# Patient Record
Sex: Female | Born: 1935 | Race: White | Hispanic: No | Marital: Married | Smoking: Never smoker
Health system: Southern US, Community
[De-identification: ages and names within clinical notes are randomized; demographics above are authoritative.]

## PROBLEM LIST (undated history)

## (undated) DIAGNOSIS — E7800 Pure hypercholesterolemia, unspecified: Secondary | ICD-10-CM

## (undated) DIAGNOSIS — I10 Essential (primary) hypertension: Secondary | ICD-10-CM

## (undated) HISTORY — PX: APPENDECTOMY: SHX54

## (undated) HISTORY — PX: CORONARY ARTERY BYPASS GRAFT: SHX141

---

## 2001-02-19 ENCOUNTER — Encounter: Payer: Self-pay | Admitting: Surgery

## 2001-02-19 ENCOUNTER — Encounter: Admission: RE | Admit: 2001-02-19 | Discharge: 2001-02-19 | Payer: Self-pay | Admitting: Surgery

## 2001-02-19 ENCOUNTER — Ambulatory Visit (HOSPITAL_BASED_OUTPATIENT_CLINIC_OR_DEPARTMENT_OTHER): Admission: RE | Admit: 2001-02-19 | Discharge: 2001-02-20 | Payer: Self-pay | Admitting: Surgery

## 2005-01-06 ENCOUNTER — Inpatient Hospital Stay (HOSPITAL_COMMUNITY): Admission: EM | Admit: 2005-01-06 | Discharge: 2005-01-15 | Payer: Self-pay | Admitting: Emergency Medicine

## 2005-01-07 ENCOUNTER — Encounter (INDEPENDENT_AMBULATORY_CARE_PROVIDER_SITE_OTHER): Payer: Self-pay | Admitting: Cardiology

## 2005-01-11 ENCOUNTER — Encounter (INDEPENDENT_AMBULATORY_CARE_PROVIDER_SITE_OTHER): Payer: Self-pay | Admitting: *Deleted

## 2005-01-27 ENCOUNTER — Encounter: Admission: RE | Admit: 2005-01-27 | Discharge: 2005-01-27 | Payer: Self-pay | Admitting: Cardiology

## 2006-04-05 ENCOUNTER — Inpatient Hospital Stay (HOSPITAL_COMMUNITY): Admission: EM | Admit: 2006-04-05 | Discharge: 2006-04-06 | Payer: Self-pay | Admitting: Emergency Medicine

## 2006-04-06 ENCOUNTER — Encounter (INDEPENDENT_AMBULATORY_CARE_PROVIDER_SITE_OTHER): Payer: Self-pay | Admitting: Cardiology

## 2007-12-30 ENCOUNTER — Inpatient Hospital Stay (HOSPITAL_COMMUNITY): Admission: EM | Admit: 2007-12-30 | Discharge: 2008-01-05 | Payer: Self-pay | Admitting: Emergency Medicine

## 2009-04-05 ENCOUNTER — Inpatient Hospital Stay (HOSPITAL_COMMUNITY): Admission: EM | Admit: 2009-04-05 | Discharge: 2009-04-09 | Payer: Self-pay | Admitting: Emergency Medicine

## 2009-10-16 IMAGING — CR DG RIBS W/ CHEST 3+V*R*
1 series · 1 of 1 positions shown · non-contrast
Comparison: 04/05/2006

CLINICAL DATA: 89-year-old female fall, trauma, right chest pain

RIGHT RIBS AND CHEST - 3+ VIEW

[view not recorded]
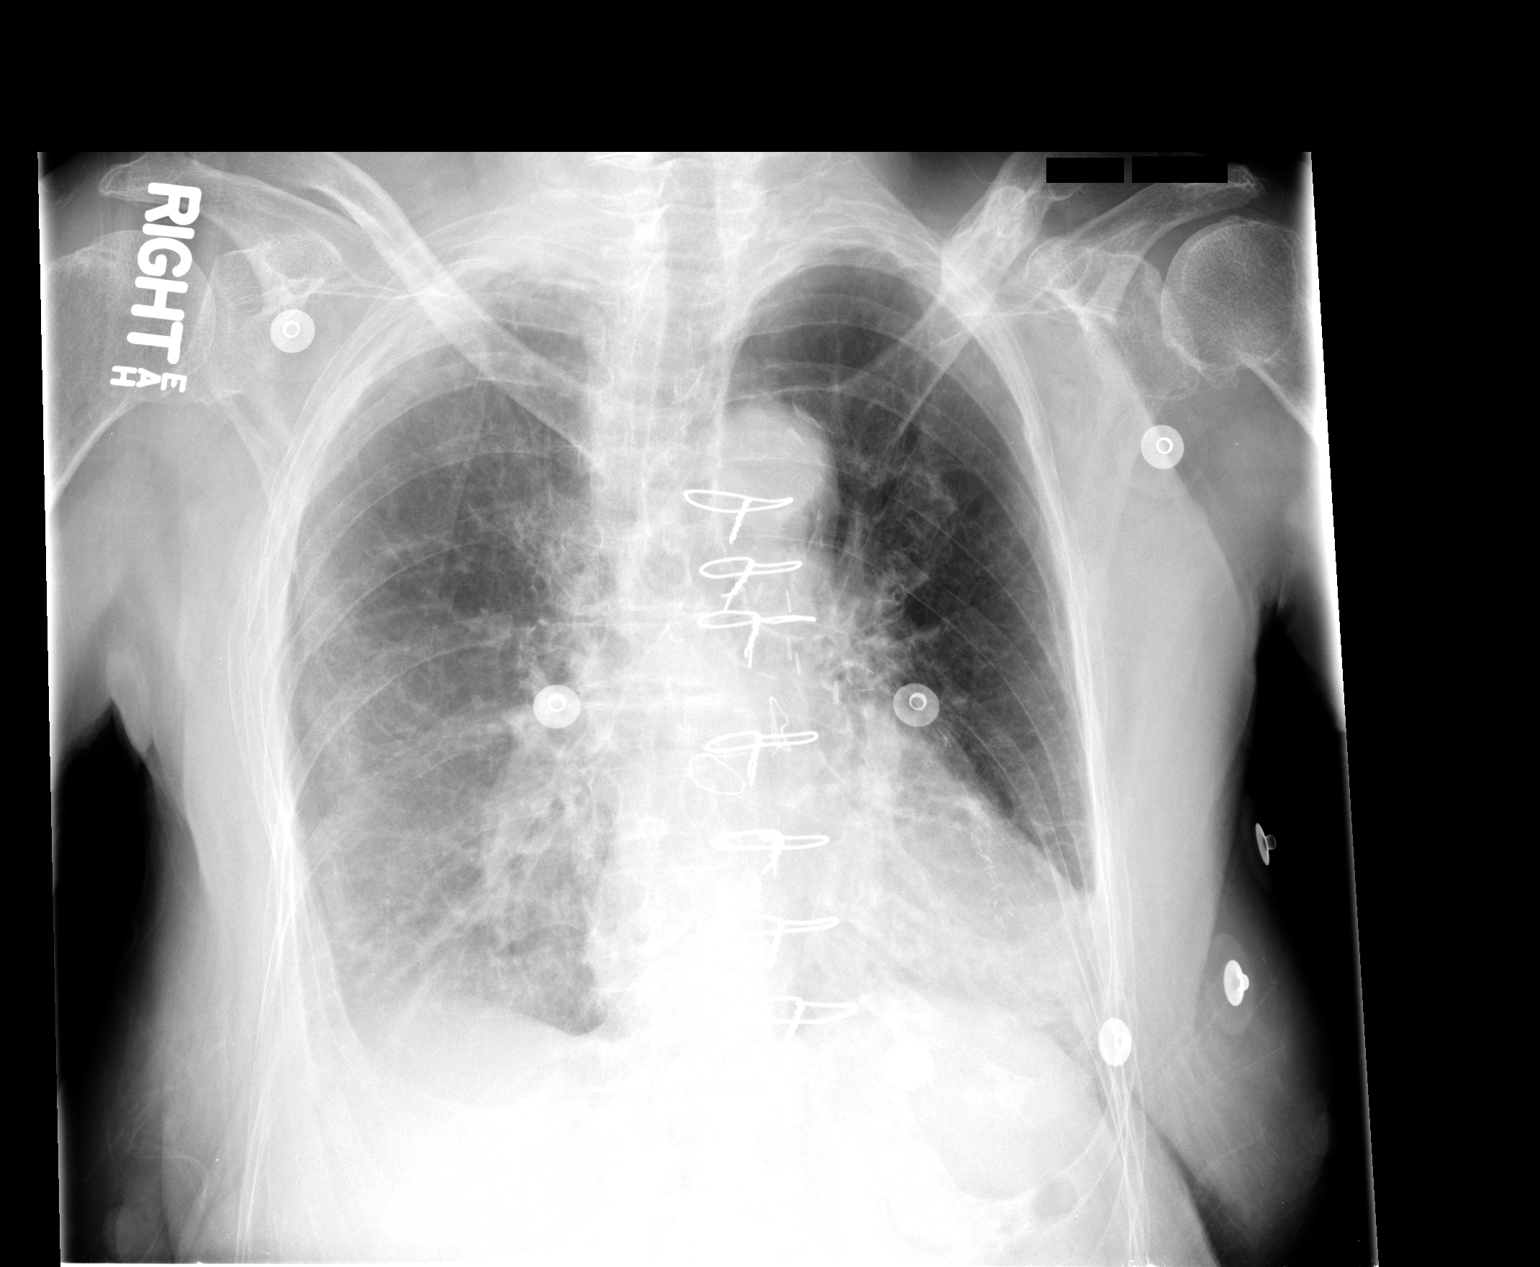

[1 of 1 positions shown; findings below may reference images not displayed]

FINDINGS: There is an acute right distal clavicle fracture at the
AC joint.  Several acute right rib fractures are noted involving
the right second through seventh ribs.  There also are remote right
rib fractures laterally.  Blunting of the right costophrenic angle
is noted ,  a small effusion is not excluded.  No large
pneumothorax.  Heart is enlarged.  The patient is status post
previous bypass surgery.  No definite CHF pattern.  Bones are
osteopenic.
IMPRESSION: Acute right second through seventh rib fractures.

Right distal clavicle fracture.

Small right effusion.

No definite pneumothorax.

## 2009-10-17 IMAGING — CR DG HIP COMPLETE 2+V*R*
3 series · 3 of 3 positions shown · non-contrast
Comparison: CT pelvis 04/05/2006

CLINICAL DATA: Status post fall.

RIGHT HIP - COMPLETE 2+ VIEW

[t pelvis a.p.]
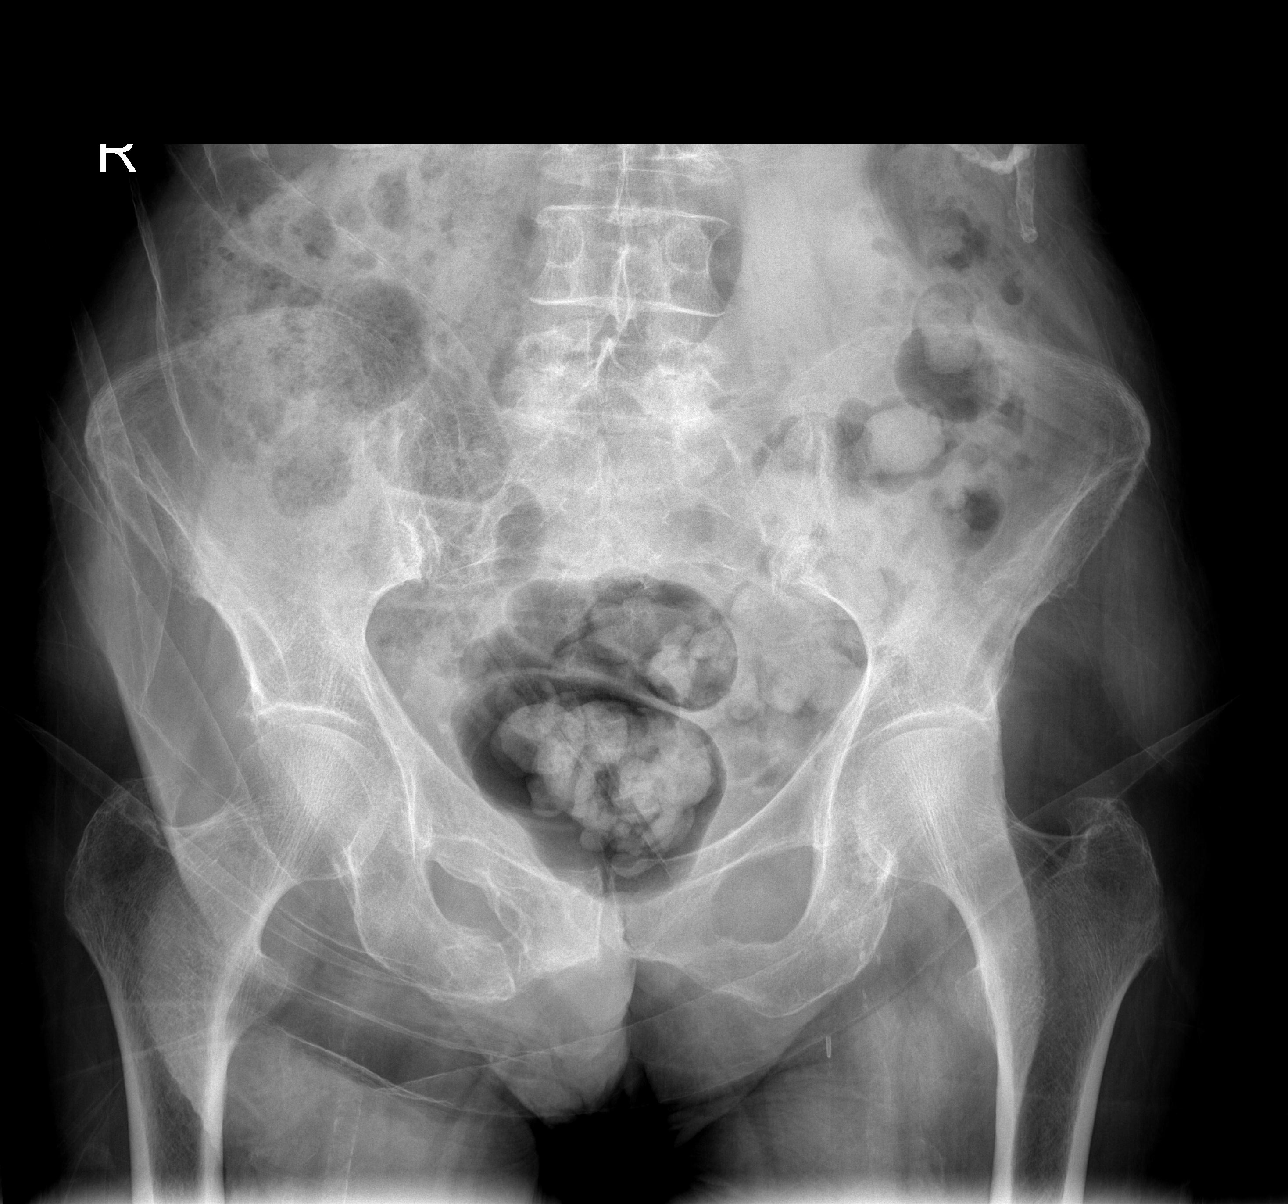

[t hip ap right]
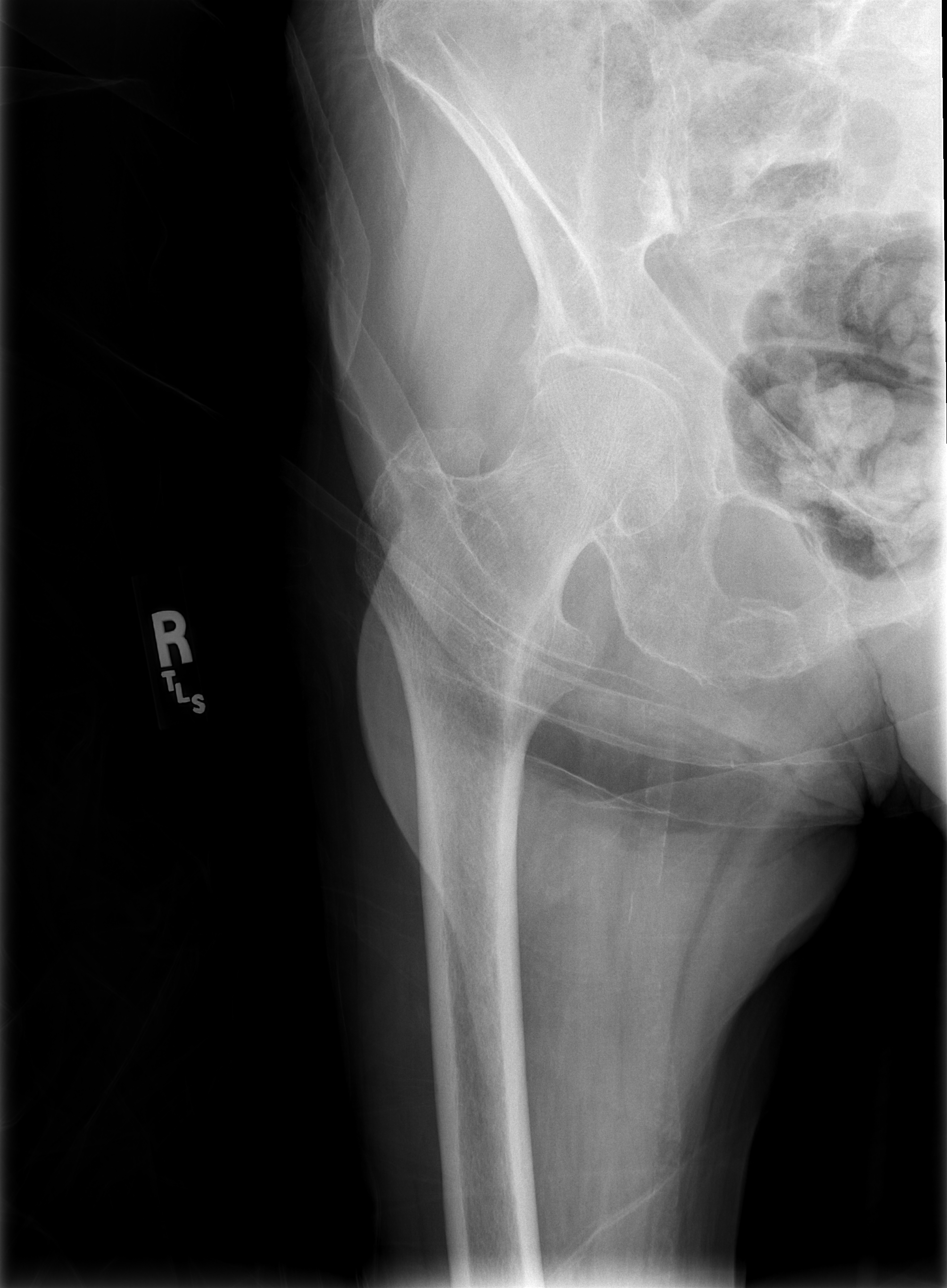

[t hip frog leg right]
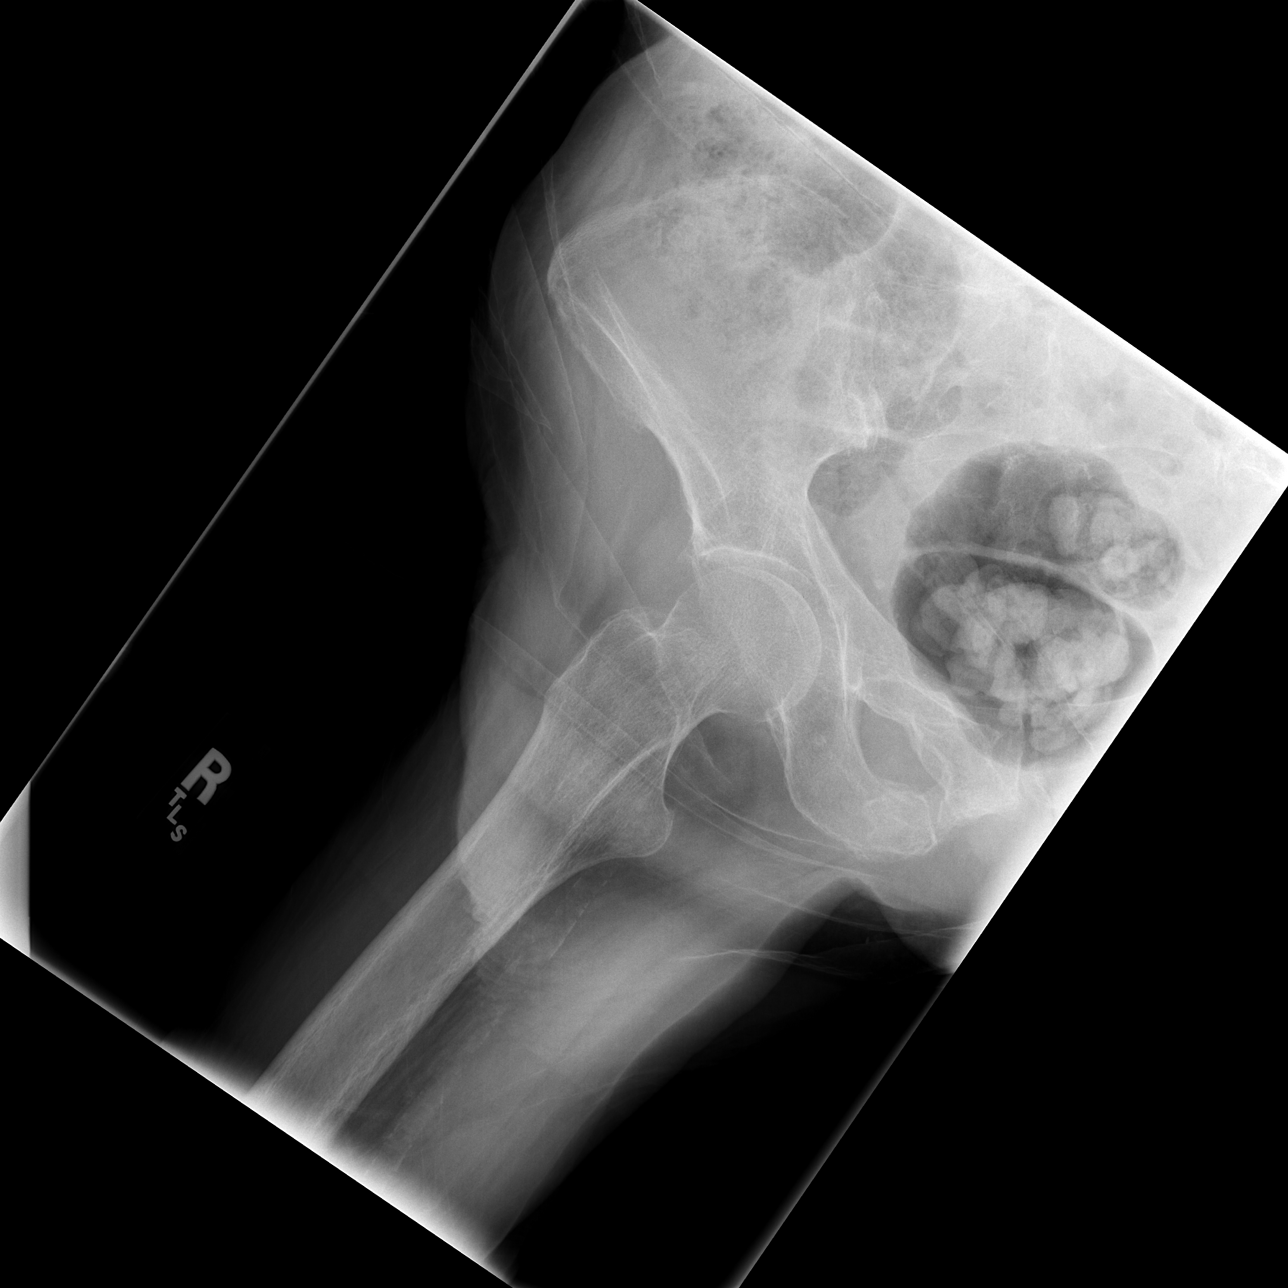

[3 of 3 positions shown; findings below may reference images not displayed]

FINDINGS: The right hip is in normal position.  No fracture is
identified.  Post-traumatic deformity of the right inferior ramus
is noted.
IMPRESSION: Negative for acute fracture.

## 2009-10-18 IMAGING — CR DG CHEST 1V PORT
1 series · 1 of 1 positions shown · non-contrast
Comparison: 12/31/2007

CLINICAL DATA: Status post fall

PORTABLE CHEST - 1 VIEW

[view not recorded]
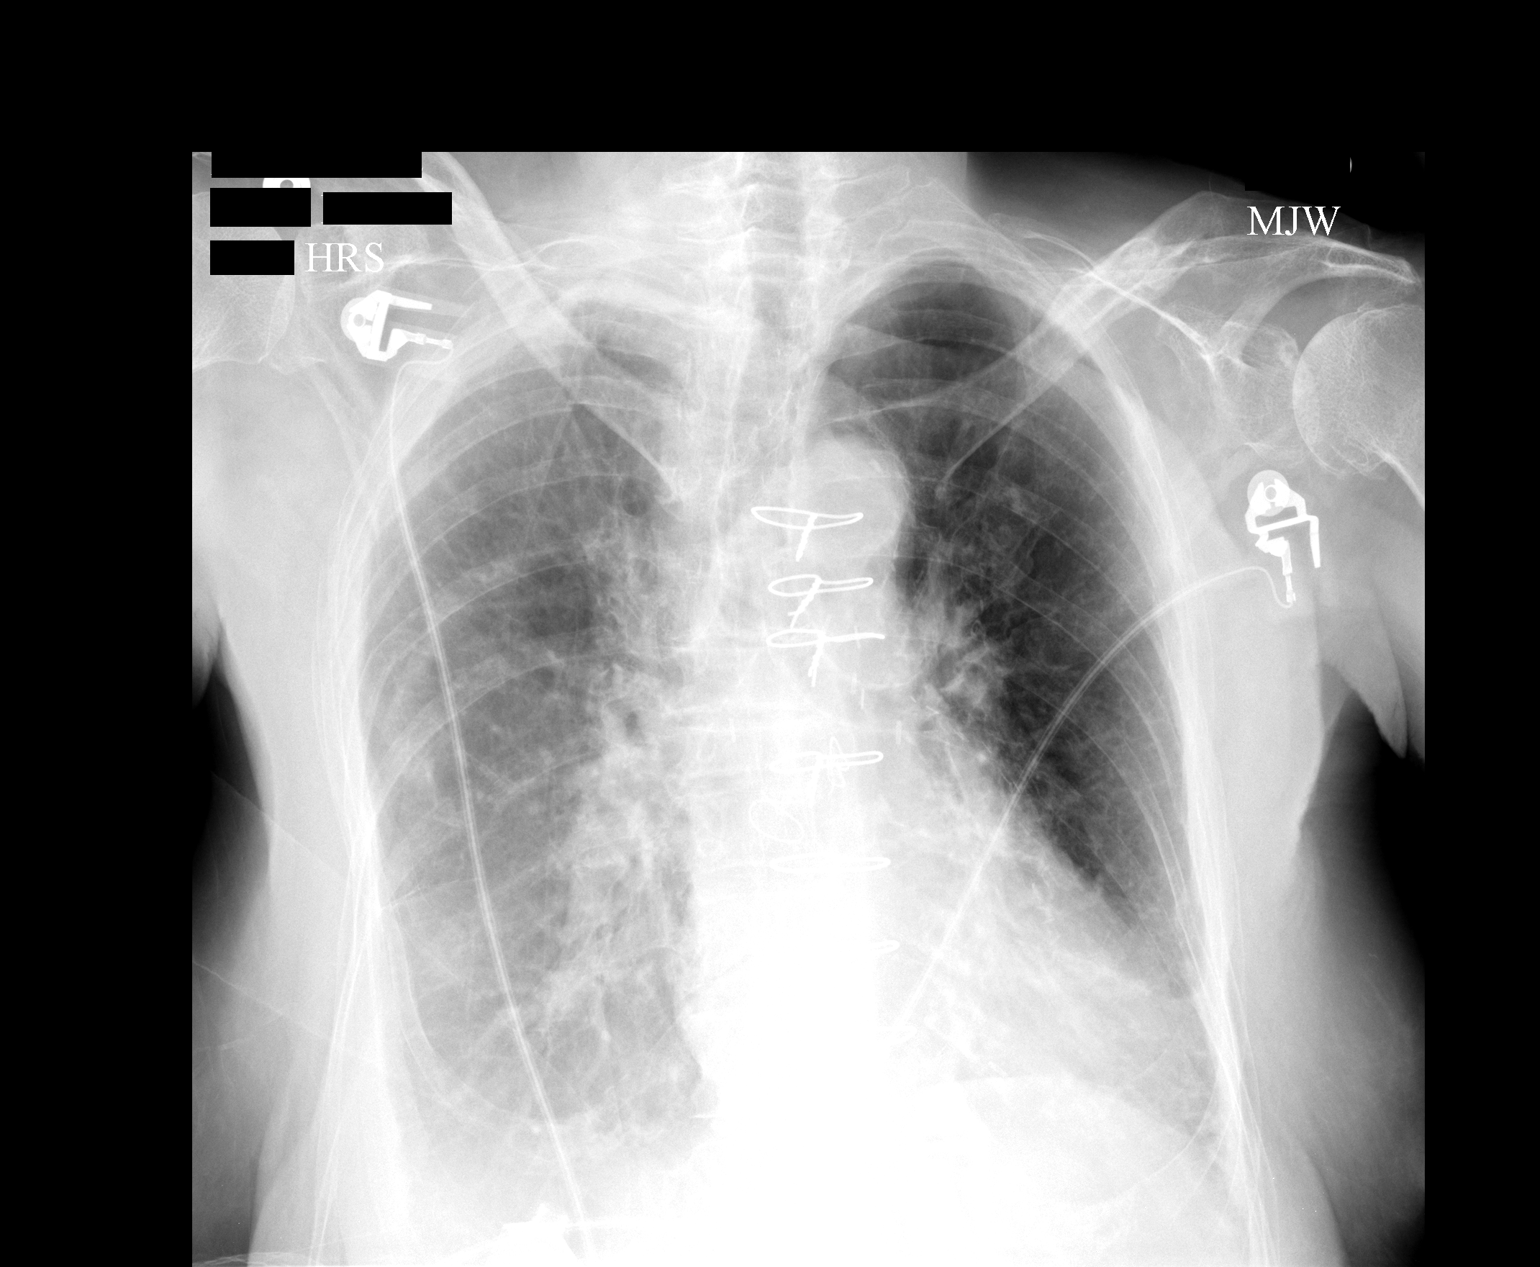

[1 of 1 positions shown; findings below may reference images not displayed]

FINDINGS: Again noted cardiomegaly and status post median
sternotomy.  The left lung is clear.  Right rib fractures again
noted without significant change.  There is a small right pleural
effusion with right basilar atelectasis or infiltrate. Pleural
thickening noted right apex .  Mild central vascular congestion
without convincing edema.
IMPRESSION: Right  rib fractures again noted without significant change. There
is small right pleural effusion with right basilar atelectasis or
infiltrate.

## 2010-04-24 ENCOUNTER — Encounter: Payer: Self-pay | Admitting: Surgery

## 2010-06-20 LAB — CBC
HCT: 32.8 % — ABNORMAL LOW (ref 36.0–46.0)
HCT: 32.9 % — ABNORMAL LOW (ref 36.0–46.0)
HCT: 36.8 % (ref 36.0–46.0)
Hemoglobin: 12.2 g/dL (ref 12.0–15.0)
MCHC: 33.1 g/dL (ref 30.0–36.0)
MCV: 92.3 fL (ref 78.0–100.0)
Platelets: 211 10*3/uL (ref 150–400)
RBC: 3.56 MIL/uL — ABNORMAL LOW (ref 3.87–5.11)
RBC: 3.99 MIL/uL (ref 3.87–5.11)
RDW: 15.7 % — ABNORMAL HIGH (ref 11.5–15.5)
WBC: 10.6 10*3/uL — ABNORMAL HIGH (ref 4.0–10.5)
WBC: 6.7 10*3/uL (ref 4.0–10.5)
WBC: 9.5 10*3/uL (ref 4.0–10.5)

## 2010-06-20 LAB — COMPREHENSIVE METABOLIC PANEL
BUN: 29 mg/dL — ABNORMAL HIGH (ref 6–23)
CO2: 21 mEq/L (ref 19–32)
Calcium: 8.5 mg/dL (ref 8.4–10.5)
Chloride: 108 mEq/L (ref 96–112)
Creatinine, Ser: 0.82 mg/dL (ref 0.4–1.2)
GFR calc Af Amer: >60 mL/min (ref >60)
GFR calc non Af Amer: >60 mL/min (ref >60)
Glucose, Bld: 105 mg/dL — ABNORMAL HIGH (ref 70–99)
Total Bilirubin: 1 mg/dL (ref 0.3–1.2)

## 2010-06-20 LAB — LIPID PANEL
Cholesterol: 138 mg/dL (ref 0–200)
HDL: 43 mg/dL (ref >39)
Total CHOL/HDL Ratio: 3.2 RATIO
Triglycerides: 96 mg/dL (ref <150)

## 2010-06-20 LAB — CARDIAC PANEL(CRET KIN+CKTOT+MB+TROPI)
CK, MB: 3.7 ng/mL (ref 0.3–4.0)
Relative Index: INVALID (ref 0.0–2.5)
Relative Index: INVALID (ref 0.0–2.5)
Relative Index: INVALID (ref 0.0–2.5)
Total CK: 83 U/L (ref 7–177)
Troponin I: 0.04 ng/mL (ref 0.00–0.06)
Troponin I: 0.06 ng/mL (ref 0.00–0.06)

## 2010-06-20 LAB — URINALYSIS, ROUTINE W REFLEX MICROSCOPIC
Bilirubin Urine: NEGATIVE
Glucose, UA: NEGATIVE mg/dL
Ketones, ur: NEGATIVE mg/dL
Specific Gravity, Urine: 1.021 (ref 1.005–1.030)
pH: 6.5 (ref 5.0–8.0)

## 2010-06-20 LAB — BASIC METABOLIC PANEL
BUN: 20 mg/dL (ref 6–23)
BUN: 28 mg/dL — ABNORMAL HIGH (ref 6–23)
CO2: 27 mEq/L (ref 19–32)
Calcium: 9.3 mg/dL (ref 8.4–10.5)
Chloride: 105 mEq/L (ref 96–112)
Creatinine, Ser: 0.78 mg/dL (ref 0.4–1.2)
Creatinine, Ser: 0.87 mg/dL (ref 0.4–1.2)
GFR calc Af Amer: >60 mL/min (ref >60)
GFR calc non Af Amer: >60 mL/min (ref >60)
GFR calc non Af Amer: >60 mL/min (ref >60)
Glucose, Bld: 130 mg/dL — ABNORMAL HIGH (ref 70–99)
Glucose, Bld: 96 mg/dL (ref 70–99)
Potassium: 3.8 mEq/L (ref 3.5–5.1)
Potassium: 3.8 mEq/L (ref 3.5–5.1)
Sodium: 139 mEq/L (ref 135–145)

## 2010-06-20 LAB — DIFFERENTIAL
Eosinophils Relative: 1 % (ref 0–5)
Lymphocytes Relative: 12 % (ref 12–46)
Lymphs Abs: 1.1 10*3/uL (ref 0.7–4.0)
Monocytes Absolute: 0.7 10*3/uL (ref 0.1–1.0)
Neutro Abs: 7.5 10*3/uL (ref 1.7–7.7)

## 2010-06-20 LAB — URINE CULTURE
Colony Count: NO GROWTH
Culture: NO GROWTH

## 2010-06-20 LAB — TSH: TSH: 2.771 u[IU]/mL (ref 0.350–4.500)

## 2010-08-17 NOTE — Consult Note (Signed)
[name], [name] NO.:  [id number]   MEDICAL RECORD NO.:  [id number]          PATIENT TYPE:  EMS   LOCATION:  [facility]                         FACILITY:  [facility]   PHYSICIAN:  Sandria Bales. Ezzard Standing, M.D.  DATE OF BIRTH:  [birth year]   DATE OF CONSULTATION:  DATE OF DISCHARGE:                                 CONSULTATION   Date of admission ??   HISTORY OF ILLNESS:  This is an 77 year old white female who is patient  of Dr. Burnell Blanks has seen Dr. Myrene Galas recently because of some  left knee problems, sees Dr. Viann Fish from a cardiac standpoint.   She has had multiple falls.  She actually injured herself 2 or 3 months  ago.  It was felt like Dr. Carola Frost treated some left patellar or knee  problems with a cast.  She just got out of it 2 or 3 weeks ago.  She  then fell last night, falling straight backwards according to her  history.  Her family kept her home during the night, but because of  pain, brought her to the emergency room.   X-rays in emergency room have revealed a right clavicle fracture and  looks like apparent right posterior rib fractures 2 through 7.   PAST MEDICAL HISTORY:  The patient is allergic to SULFA.  There is  pretty remote history, unclear.   CURRENT MEDICATIONS:  1. Metoprolol 25 mg b.i.d.  2. Lisinopril 10 mg b.i.d.  3. Vytorin.  4. Levothroid dose unknown.  5. Fosamax.  6. Paroxetine 20 mg at bedtime.  7. She takes a multivitamin.   REVIEW OF SYSTEMS:  NEUROLOGIC:  She had a stroke in 1989, but she says  she has fully recovered from this and no neurologic deficits.  She has  had no further neurologic events.  PULMONARY:  She has no history of  pneumonia or tuberculosis, does not smoke cigarettes.  CARDIAC:  She had a 4-vessel coronary artery bypass Dr. Evelene Croon in  October 2006.  She sees Dr. Viann Fish from a cardiac standpoint and  said she saw him in the last month or 2.  She is apparently been stable  from a  cardiac standpoint.  These falls had been more from a balance and  just activity issue than from any neurologic or syncopal issue.  She  does have hypertension.  GASTROINTESTINAL:  She has had no peptic ulcer disease, liver disease or  colon disease.  GYN:  She had a hysterectomy in 1969.  She has 4 children, the older of  her children, Tracey Harries is at the bedside.  ENDOCRINE:  She is hypothyroid on thyroid replacement, unsure of the  medicine.  MUSCULOSKELETAL:  She has got out of being treated by Dr. Carola Frost about 3  weeks ago for dislocation of her knee.  She thinks it is the right knee.   SOCIAL HISTORY:  She lives with her husband.  Her daughter Tracey Harries  lives near by and she has 4 children.   PHYSICAL EXAMINATION:  VITAL SIGNS:  Her temperature 98.1, pulse  61,  blood pressure 126/60.  HEENT:  Unremarkable.  She has no obvious head injury.  NECK:  Supple, shows no mass, no thyromegaly.  LUNGS:  She has trouble with deep inspiration with decreased breath  sounds in the right side.  She is tender over the right clavicle midway.  She is tender over the right chest.  I do not feel much in the left  chest.  She has a well-healed median sternotomy scar.  HEART:  Her heart has regular rate and rhythm without murmur or rub.  ABDOMEN:  Soft.  I feel no tenderness, no mass.  EXTREMITIES:  She has deformity of both wrists.  She has a bruise over  the right wrist.  She said she had old fracture, which she never really  got fixed.  She has an abrasion and laceration of her right lower leg,  which has been taken care of by [employer] at home.  She has good  strength in upper and lower extremities.  She is a thin,  older white  female.   Her labs I have show a sodium 139, potassium 4.0, chloride 111, BUN 42,  creatinine 2.0 on one and 1.6 on another lab.  Her hemoglobin 10,  hematocrit 30, and white blood count 11,500.   DIAGNOSES:  1. Right mid clavicle fracture.  She sees Dr.  Myrene Galas.  We will      get in touch with him tomorrow.  2. Right rib fracture 2 through 7, needs to be observed in hospital at      least for couple of days.  Though she is not very tender and doing      pretty well right now, she is having some trouble moving.  3. History of falls, presumably secondary to trying to go too fast and      not any kind of neurological or cardiac event that we are aware of.  4. History of coronary artery bypass graft, for which she has done      well, followed by Dr. Viann Fish.  5. Hypertension.  6. Hypothyroidism, replaced.  She does not know her medicine dose.  7. History of left knee cast for dislocated or injured left knee.      This was managed by Dr. Judie Petit. Handy.  8. Remote history of stroke with no recent symptoms.      Sandria Bales. Ezzard Standing, M.D.  Electronically Signed     DHN/MEDQ  D:  12/30/2007  T:  12/31/2007  Job:  [id number]   cc:   Burnell Blanks, MD  Georga Hacking, M.D.  Doralee Albino. Carola Frost, M.D.

## 2010-08-17 NOTE — H&P (Signed)
NAMEPAISLEE, SZATKOWSKI NO.:  [id number]   MEDICAL RECORD NO.:  [id number]          PATIENT TYPE:  EMS   LOCATION:  [facility]                         FACILITY:  [facility]   PHYSICIAN:  Elliot Cousin, M.D.    DATE OF BIRTH:  08-Aug-1916   DATE OF ADMISSION:  12/30/2007  DATE OF DISCHARGE:                              HISTORY & PHYSICAL   PRIMARY CARE PHYSICIAN:  Burnell Blanks[place] M.[place]. [place].   PRIMARY CARDIOLOGIST:  Georga Hacking, M.D.   PRIMARY ORTHOPEDIC SURGEON:  Doralee Albino. Carola Frost, M.D.   CHIEF COMPLAINT:  Fall after feeling dizzy and resultant pain.   HISTORY OF PRESENT ILLNESS:  The patient is an 77 year old woman with a  past medical history significant for coronary artery disease, status  post four-vessel CABG, diet-controlled diabetes mellitus, and history of  dizziness with recurrent falls.  She presents to the emergency  department with the chief complaint of right shoulder pain and right-  sided chest pain.  Apparently, the patient was walking up her steps at  home yesterday at approximately 4 p.m.  She became lightheaded and fell  backward on the cement sidewalk.  She fell on her right side.  At the  time, she did have some mild pain about her right shoulder and right  chest.  She scraped her right leg on the cement upon falling.  She did  hit her head, but she did not lose consciousness.  She denies loss of  consciousness prior to the fall.  Her family helped her back into the  house.  They decided to monitor her overnight.  However, this morning  the patient was in severe pain and was unable to ambulate because of the  pain.  She describes her pain as achy and sometimes sharp. It is now a 4-  5/10 in intensity. The pain is located primarily over the right shoulder  and neck and the right chest wall.  She denies current headache.  She is  able to move her legs in the supine position.  However, because of the  chest pain and shoulder pain,  she has difficulty ambulating.  She does  not recall chest pain, palpitations or shortness of breath prior to her  falling yesterday.  She generally ambulates with a walker.  However,  yesterday as she was walking up the steps at home, she was not using a  walker.  The patient has a history of multiple falls in the past  attributed to dizziness.  She has been evaluated extensively according  to the patient and family's history.   During the evaluation in the emergency department, the patient is noted  to be afebrile and hemodynamically stable.  A number of radiographic  studies were ordered.  The results are significant for an acute right  2nd through 7th rib fractures, right distal clavicle fracture, and a  small right effusion.  No evidence of pneumothorax per the rib film.  The x-ray of her right wrist reveals a healed remote distal radial  fracture, osteoarthritis, and osteopenia.  The x-ray of her  right  shoulder reveals an acute distal right clavicle fracture and osteopenia.  CT scan of her head reveals no acute intracranial abnormality and no  acute traumatic injury identified; chronic ischemic changes and mild  inflammatory changes in the right sphenoid sinus.  Trauma  surgeon/general surgeon, Dr. Ezzard Standing, evaluated the patient and  recommended pain management.  According to his note, he will contact the  patient's orthopedic surgeon, Dr. Carola Frost, tomorrow.  The patient will be  admitted for further evaluation and management.   PAST MEDICAL HISTORY:  1. History of dizziness and falls and recurrent syncope in the past.      Apparently the patient was evaluated extensively approximately 2-3      years ago and the workup was negative per history.  2. Coronary artery disease, status post four-vessel CABG in October      2006.  3. Hypertension.  4. Hyperlipidemia.  5. Diet-controlled diabetes mellitus.  6. Hypothyroidism.  7. Depression with anxiety.  8. History of stroke.  9.  Diverticulosis.  10.Osteoporosis.  11.Osteoarthritis.  12.Osteopenia.  13.Cataracts.  14.Status post hysterectomy.  15.Recent left knee arthroscopic surgery by Dr. Carola Frost in August 2009.   MEDICATIONS:  1. Vicodin 5/325 mg given once.  2. Acetaminophen 500 mg 2 tablets given once.  3. Levothyroxine question dose daily.  4. Calcium with vitamin D 1 tablet daily.  5. Multivitamin once daily.  6. Metoprolol 25 mg half a tablet b.i.d.  7. Detrol LA 4 mg once daily.  8. Fenofibrate 134 mg once daily.  9. Darvocet 1 tablet every morning.  10.Lisinopril 20 mg  half a tablet b.i.d.  11.Paroxetine 20 mg at bedtime.   ALLERGIES:  The patient has an allergy to SULFA drugs.   SOCIAL HISTORY:  The patient is married.  She lives with her husband in  [place], [place].  She has a Manufacturing engineer 5 days  weekly.  Her family rotates weekly with assistance.  She generally  ambulates with a walker.  She does not drive.  She denies alcohol,  tobacco and illicit drug use.   FAMILY HISTORY:  Noncontributory because of age.   REVIEW OF SYSTEMS:  The patient's review of systems is positive for  urinary incontinence, arthritic changes and pain in her knees.  Her  review of systems is negative for pain with urination, fever, chills,  headache, chest pain, shortness of breath, nausea, abdominal pain,  vomiting and diarrhea.   PHYSICAL EXAMINATION:  VITAL SIGNS: Temperature 98.1, blood pressure  126/60, pulse 61, respiratory rate 18, oxygen saturation 100% on oxygen  supplementation.  GENERAL:  The patient is a pleasant, alert, elderly 77 year old  Caucasian woman who is currently lying in bed in no acute distress.  HEENT:  Head is normocephalic, nontraumatic.  I do not detect a scalp  abrasion or nodule.  Pupils equal, round, reactive to light.  Extraocular movements are intact.  Conjunctivae are clear.  Sclerae are  white.  Tympanic membranes reveal mild cerumen bilaterally;  otherwise no  acute changes.  NECK:  Supple.  No adenopathy, no thyromegaly, no bruit or JVD.  LUNGS:  Decreased breath sounds in the bases.  HEART:  S1 and S2 with a soft systolic murmur.  CHEST:  There is a well-healed sternotomy scar.  The distal right  clavicle is mildly to moderately tender.  The right chest wall has a few  ecchymoses; otherwise no skin abrasions.  Mildly to moderately tender  over the right lateral chest wall.  ABDOMEN:  Positive bowel sounds, soft, nontender, nondistended.  No  hepatosplenomegaly, no masses palpated.  GU/RECTAL:  Deferred.  EXTREMITIES:  A small area of abrasion on the right lateral ankle, not  bleeding.  Diffuse arthritic changes seen in her knees and her feet  bilaterally.  The patient has an excellent range of motion of her lower  extremities bilaterally with regards to knee and hip flexion, extension  and internal and external rotation.  Pedal pulses palpable bilaterally.  NEUROLOGIC:  The patient is alert and oriented x3.  Cranial nerves II-  XII are intact.  Strength in the supine position is 5/5 upper and lower  extremities symmetrically.  Sensation is grossly intact.  Gait was not  assessed.   LABORATORY DATA:  Radiographic results are above.  PTT 25.  PT 13.5. WBC  11.5, hemoglobin 9.8, platelets 172,000, sodium 138, potassium 4,  chloride 111, glucose 159, BUN 42, creatinine 2, ionized calcium 1.09,  bicarbonate 22.  Urinalysis nitrite positive, leukocytes moderate, micro  urine 11-20 wbc's and many bacteria.   ASSESSMENT:  1. Status post fall secondary to dizziness.  The patient has a      recurrent history of falls attributed to dizziness.  The patient      may have a disequilibrium problem or chronic labyrinthitis.      According to her history, she did not lose consciousness prior to      or after the fall.  2. Acute distal right clavicle fracture, acute right rib fractures (2      through 7).  There is no evidence of a  pneumothorax on the rib      films.  However, there is a small effusion.  The patient is      experiencing a significant amount of discomfort, particularly when      she tries to ambulate.  3. Urinary tract infection.  The patient does have urinary      incontinence, however, no dysuria.  With this clinical scenario, I      would favor treating the patient with an antibiotic for a      subclinical urinary tract infection.  The patient's white blood      cell count is mildly elevated as well.  She is currently afebrile.  4. Acute renal insufficiency.  The patient's BUN is 42 and her      creatinine is 2.  She is treated chronically with an ACE inhibitor.      The patient appears to be somewhat volume depleted.  5. Anemia.  The patient's hemoglobin is currently 9.8.  Baseline      hemoglobin is unknown at this time.  6. History of coronary artery disease, hypertension, type 2 diabetes      mellitus, hypothyroidism, and hyperlipidemia.  These medical      conditions appear to be somewhat stable.  However, she will be      monitored and evaluated accordingly.   PLAN:  1. Trauma surgeon, Dr. Ezzard Standing, evaluated the patient and will call Dr.      Carola Frost tomorrow according to his note.  2. Will consult Dr. Carola Frost, the patient's orthopedic surgeon, in the      morning if he has not been already contacted by Dr. Ezzard Standing.  3. Will start pain management with scheduled dosing of Tylenol and as-      needed dosing of Vicodin and morphine.  4. Will start gentle IV fluids and follow the patient's renal      function.  5. Will culture the urine and start Rocephin empirically.  6. Will encourage the patient to use the incentive spirometry.  Will      ask the physical and occupational therapist to assist the patient      with balance training and reconditioning.  7. Will hold the ACE inhibitor.  8. Will check iron studies.  9. For further evaluation, will rule out MI, will assess her thyroid       function, will check a vitamin B12 level, assess her A1c, and      assess for orthostatic hypotension.      Elliot Cousin, M.D.  Electronically Signed     DF/MEDQ  D:  12/30/2007  T:  12/30/2007  Job:  [id number]   cc:   Burnell Blanks, MD  Georga Hacking, M.D.  Doralee Albino. Carola Frost, M.D.

## 2010-08-17 NOTE — Discharge Summary (Signed)
[name], [name] NO.:  [id number]   MEDICAL RECORD NO.:  [id number]          PATIENT TYPE:  INP   LOCATION:  2005                         FACILITY:  [facility]   PHYSICIAN:  Hind I Elsaid, MD      DATE OF BIRTH:  10-18-1916   DATE OF ADMISSION:  12/30/2007  DATE OF DISCHARGE:  01/05/2008                               DISCHARGE SUMMARY   PRIMARY CARE PHYSICIAN:  Burnell Blanks, [place] in [place], [place].   CARDIOLOGIST:  Georga Hacking, M.D.   DISCHARGE DIAGNOSES:  1. Acute distal right clavicle fracture.  2. Acute right rib fracture II through VII.  3. Right pleural effusion, no evidence of hemothorax.  4. Escherichia coli urinary tract infection.  5. Vitamin B12 deficiency.  6. Delirium, resolved.  7. Anemia.  8. History of coronary artery disease.  9. Hypertension.  10.Type 2 diabetes mellitus.  11.Hypothyroidism.  12.Hyperlipidemia.   DISCHARGE MEDICATIONS:  1. Vicodin 5/325 p.o. q.6 h. as needed.  2. Synthroid 75 mcg daily.  3. Lisinopril 10 mg twice daily.  4. Metoprolol 25 mg twice daily.  5. Detrol 4 mg everyday.  6. TriCor 145 mg daily.  7. Calcium Os-Cal 3 times daily.  8. _zocor 20 mg at bedtime.  9. Macrobid 50 mg q.6 h. for 2 days.  10.Vitamin B12 p.o.  11.MiraLax 17 g p.o. daily.  12.Senna 2 tablets p.o. q.h.s.   CONSULTATION:  Trauma Surgery consulted.   PROCEDURES:  1. Chest x-ray, mild increased volume of the right effusion, stable      acute right rib fracture.  2. Rib x-ray, acute right II through VII rib fracture.  Right distal      clavicle fracture.  Small right effusion, no definitive      pneumothorax.  3. Wrist x-ray: Healed remote distal radius fracture.  Osteoarthritis      and osteopenia.  4. Shoulder x-ray, acute distal right clavicular fracture.  5. CT head.  No acute intracranial abnormality.  6. Hip x-ray negative for acute fracture.  7. Chest x-ray 2-view, improvement in right pleural effusion,  multiple      right fracture and left clavicle fracture without evidence of      pneumothorax.   HISTORY OF PRESENT ILLNESS:  Please review the history done by Dr.  Elliot Cousin.   SUMMARY:  1. This is an 77 year old female with past medical history of coronary      artery disease status post 4-vessel CABG, diet-controlled diabetes      mellitus, and history of dizziness with recurrent falls, presented      at this time with status post fall with right shoulder pain, right      chest pain.  The patient was found to have right clavicular      fracture and right rib fracture.  The patient was evaluated by the      Trauma Surgery.  Recommendation was conservative measurement and no      surgery at this time.  Nonweightbearing of the right upper      extremity, also provided sling  to the right upper extremity for      comfort, and PT and OT consultation was done.  During hospital      stay, the patient has complicated by a period of delirium and      confusion.  Further CT head was negative and was felt delirium is      due to altered mental status secondary to delirium and      pyelonephritis.  The patient significantly improved within 24      hours.  2. Right pleural effusion on the x-ray, this shows some improvement on      her x-ray and the patient did not require any form of invasive      oxygenation.  Trauma Surgery was asked to reevaluate this right      effusion.  AP and lateral view did show improvement, so no need for      tap or chest tube placement at this time.  3. Anemia.  The patient was found to have vitamin B12 of 375 and high      methylmalonic acid, for that the patient was placed on vitamin B12      p.o.  Hemoglobin remained stable during hospital stay.  4. Constipation.  The patient will be discharged with laxative      medications and provide Fleet enema before discharge.  At this      time, we felt the patient is medically stable to be discharged      home,  follow with her primary care physician within 1 week.      Hind Bosie Helper, MD  Electronically Signed     HIE/MEDQ  D:  01/05/2008  T:  01/05/2008  Job:  [id number]   cc:   Burnell Blanks, MD

## 2010-08-20 NOTE — Op Note (Signed)
NAMEEUGENIE, [name] NO.:  [id number]   MEDICAL RECORD NO.:  [id number]          PATIENT TYPE:  INP   LOCATION:  2305                         FACILITY:  [facility]   PHYSICIAN:  Guadalupe Maple, M.D.  DATE OF BIRTH:  [birth year]   DATE OF PROCEDURE:  01/10/2005  DATE OF DISCHARGE:                                 OPERATIVE REPORT   PROCEDURE:  Intraoperative transesophageal echocardiography.   ANESTHESIOLOGIST:  Guadalupe Maple, M.D.   INDICATION:  [name] is an 77 year old white female with a history  of severe three-vessel coronary artery disease and left main coronary  disease, who was noted to have mitral regurgitation and is scheduled to  undergo coronary artery bypass grafting.  Dr. Laneta Simmers requested that we  evaluate the mitral valve with intraoperative transesophageal  echocardiography and to evaluate the left ventricle and determine if any  other valvular pathology was present.   The patient was brought to the operating room at [facility] and  general anesthesia was induced without difficulty.  The transesophageal  echocardiography probe was then inserted into the esophagus without  difficulty.   PREBYPASS FINDINGS:  1.  Mitral valve:  The mitral valve leaflets were thickened and they coapted      well without evidence of prolapse or fluttering and no evidence of torn      chordae.  There was a central jet of mitral regurgitation which was      graded at 1 to 2+.  There was mild blunting of the systolic component of      the right upper pulmonary vein flow by pulse wave Doppler.  The      effective regurgitant orifice area measured 0.133 by the PISA method.      The jet was central and there were no wall-hugging jets.  2.  Aortic valve:  The aortic valve was trileaflet and appeared to open      normally.  There were some Lambl excrescences present on the tips of the      leaflets and there was no aortic insufficiency.  3.  Left ventricle:   There was moderate hypokinesis in the area of the      basilar septal region, but good contractility in all the other areas      interrogated and the ejection fraction was estimated at 50% to 55%.      There was no thrombus noted in the left ventricular apex.  4.  Right ventricle.  The right ventricle appeared to be within normal      limits in size.  There was good contractility of the right ventricular      free wall.  5.  Tricuspid valve:  The tricuspid valve appeared structurally normal with      trace to 1+ tricuspid insufficiency.  6.  Pulmonic valve:  There was trace pulmonic insufficiency.  7.  Interatrial septum:  There was no evidence of atrioseptal defect or      patent foramen ovale.  8.  Left atrium:  There was no evidence of thrombus in the  left atrial or      the left atrial appendage.  The left atrium appeared to be within upper      limits of normal of size.  9.  Proximal ascending aorta:  There was diffuse calcification noted in the      wall of the proximal ascending aorta, but no mobile atheromata present.   POSTBYPASS FINDINGS:  1.  Mitral valve:  There was a central jet of mitral insufficiency which was      again graded at 1 to 2+, unchanged from the prebypass study.  2.  Left ventricle:  The left ventricular function again appeared unchanged      from the prebypass study.  There was good contractility in all segments,      except the basilar septal region which showed moderate-to-severe      hypokinesis.  Ejection fraction was estimated 50% to 55%.  3.  Aortic valve:  The aortic valve was unchanged from the prebypass study,      there was no aortic insufficiency and the valve appeared to open      normally.           ______________________________  Guadalupe Maple, M.D.     DCJ/MEDQ  D:  01/10/2005  T:  01/11/2005  Job:  [id number]

## 2010-08-20 NOTE — Op Note (Signed)
[place]  Patient:    NARDOS, PUTNAM Visit Number: [id number] MRN: [id number]          Service Type: Attending:  Currie Paris, M.D. Dictated by:   Currie Paris, M.D. Proc. Date: 02/19/01   CC:         Burnell Blanks, M.D.   Operative Report  ACCOUNT NO. [id number]. CCS [id number].  PREOPERATIVE DIAGNOSIS:  Bilateral inguinal hernias.  POSTOPERATIVE DIAGNOSIS:  Bilateral inguinal hernias.  PROCEDURE:  Repair of bilateral inguinal hernias.  SURGEON:  Currie Paris, M.D.  ANESTHESIA:  General (LMA).  CLINICAL HISTORY:  Ms. Garczynski is an 77 year old who has bilateral inguinal hernias and desired to have these repaired.  We have decided to do these as an outpatient with an overnight stay for observation, since she is 73, felt that would be most appropriate.  DESCRIPTION OF PROCEDURE:  The patient brought to the operating room, having been seen in the holding area and all questions answered.  After satisfactory general anesthesia, the lower abdomen was prepped and draped.  As I worked, I injected 0.5% Marcaine plus 1% Xylocaine with epinephrine mixed equally  to help with postop analgesia.  This was infiltrated along the left inguinal area and the incision made deep to the external oblique aponeurosis with additional local infiltrated as I got deeper layers.  I opened the external oblique in the line of its fibers and identified posterior structures.  The round ligament and associated nerves were dissected up off the inguinal floor. There was a definite small direct defect present as well as some protruding fat through the internal ring.  I cleaned all of this off and put a suture in to close the direct defect and then laid a mesh patch overlaying the entire floor.  This was then standard patch and was split to go around the cord structures so I did not have to divide those.  This was sutured in inferiorly with a running suture  and then tacked medially with several sutures.  The tails were crossed and then tacked together, producing a nice repair with good reinforcement of the floor and leaving a very narrow opening just before the nerves and round ligament.  Additional local was infiltrated and the incision closed with 3-0 Vicryl, which closed the external oblique and Scarpas, and 4-0 Monocryl subcuticular on the skin.  Prior to closing completely, I dissected down a little bit around the femoral canal to make sure there was no femoral hernia, and there was none.  Attention was turned to the right side and identical repair done with the exception that on the right side there was a large indirect opening with a very patulous indirect ring.  A small mesh patch was placed into that and held with a Prolene, which closed the ring, and then the patch overlaid as on the left side.  Again local was used.  The patient tolerated the procedure well.  There were no operative complications, and all counts were correct. Dictated by:   Currie Paris, M.D. Attending:  Currie Paris, M.D. DD:  02/19/01 TD:  02/19/01 Job: [id number]

## 2010-08-20 NOTE — Discharge Summary (Signed)
[name], CLINE NO.:  [id number]   MEDICAL RECORD NO.:  [id number]          PATIENT TYPE:  INP   LOCATION:  2003                         FACILITY:  [facility]   PHYSICIAN:  Evelene Croon, M.D.     DATE OF BIRTH:  Jan 23, 1917   DATE OF ADMISSION:  01/06/2005  DATE OF DISCHARGE:                                 DISCHARGE SUMMARY   ADMITTING DIAGNOSIS:  Chest pain.   DISCHARGE DIAGNOSES:  1.  Hypertension.  2.  Hyperlipidemia.  3.  Diverticulosis.  4.  Diabetes mellitus.  5.  History of cerebrovascular accident.  6.  Anxiety.  7.  Depression.  8.  Hypothyroidism.  9.  Osteoporosis by history.  10. Left main and severe three-vessel coronary artery disease status post      coronary artery bypass grafting times four.   PAST MEDICAL HISTORY:  1.  Hypertension.  2.  Hyperlipidemia.  3.  Diverticulosis.  4.  Diabetes mellitus.  5.  History of CVA.  6.  Anxiety.  7.  Depression.  8.  Hypothyroidism.  9.  Osteoporosis by history.  10. Left main and severe three-vessel coronary artery disease status post      coronary artery bypass grafting times four.   ALLERGIES:  No known drug allergies.   BRIEF HISTORY:  The patient is an 77 year old Caucasian female who has a  longstanding history of hypertension and hyperlipidemia.  She was evaluated  by Dr. Val Riles approximately two weeks prior to admission at which time  she stated she had a crazy feeling in her chest that radiated to the right  upper arm and lasted for several minutes.  This was not painful, but rather  a pressure.  It was not associated with exertion.  She refused stress  testing at that time.  Since then, however, the patient has experienced  three to four additional episodes, which were associated with shortness of  breath.  After the last episode that kept the patient awake all night she  then presented to Dr. Conchita Paris office again on January 06, 2005 at which  time she was admitted to [facility] emergency room.   The patient was evaluated in the ER by Dr. Donnie Aho and was found to have an  abnormal baseline EKG with an old anteroseptal infarction as well as ST  depression in the lateral leads.  The patient was asymptomatic at that time;  and, she is admitted to a telemetry bed for observation.  She was placed on  Lovenox and IV nitroglycerin.  She was then scheduled for elective cardiac  cath.   HOSPITAL COURSE:  The patient was admitted via the emergency room as  previously stated.  She was maintained on routine hospital care and remained  in stable condition until she could be taken for elective cardiac cath on  January 07, 2005.  This revealed left main and three-vessel coronary artery  disease with moderate left ventricular dysfunction.  Secondary to these  findings Dr. Evelene Croon of the CVTS service was consulted regarding  surgical revascularization.   Dr. Laneta Simmers evaluated  the patient on January 07, 2005 and it was his opinion  that the patient should proceed with coronary artery bypass grafting.   The patient was taken to the OR on January 10, 2005 for coronary artery  bypass grafting times four.  The left internal mammary artery was grafted to  the LAD, saphenous vein was grafted sequentially to the ileum and to the  diagonal, and saphenous vein graft was grafts to the right coronary artery  after right __________  artery endarterectomy.  Endoscopic vein harvesting  was performed on the left lower extremity.  The patient tolerated the  procedure well and was given __________  immediately postoperatively.  The  patient was transferred from the OR to the SICU in stable condition.  The  patient was extubated without complications, awakened from anesthesia and  was neurologically intact.   On postoperative day one the patient was afebrile with stable vital signs  and a paced rhythm.  All invasive lines and chest tubes were discontinued in  a routine manner.  All  drips were weaned accordingly.  The pacemaker was  subsequently discontinued and she has maintained a normal sinus rhythm since  that time.  The patient had some volume overload postoperatively and was  diuresed accordingly.  She will require further diuresis as an outpatient,  short term.   The patient's diabetes mellitus has been well-controlled on sliding scale  insulin and Lantus.  Her blood sugars have been excellent; however,  preoperatively she was not on any medications, but regulated her blood sugar  with a careful diet.  Therefore she will be sent home without oral  medications or insulin.  She will be instructed to continue her preoperative  diet control.   The patient began cardiac rehab on postoperative day one and has increased  her tolerance to a satisfactory level at this time.  The remainder of the  postoperative course has progressed as expected.   On postoperative day four the patient was without complaints.  Her bowel  function had returned.  She was afebrile with stable vital signs and  maintaining a normal sinus rhythm.  On physical exam the incisions are  clean, dry and intact.  Cardiac has regular rate and rhythm.  The lungs are  clear.  The extremities reveal no edema.  The patient is in stable condition  at this time; and, as long as she continues to progress OM the current  regimen she will be ready for discharge within the next one to two days  pending morning rounds reevaluation.   LABORATORY DATA:  CBC on January 12, 2005;  white count 14.8, hemoglobin  12.3, hematocrit 36 and platelets 144,000.  BMET on January 13, 2005; sodium  140, potassium 4.0, BUN 14, creatinine 0.7, and glucose 108.   DISCHARGE CONDITION:  The condition on discharge is improved.   DISCHARGE INSTRUCTIONS:   MEDICATIONS:  1.  Aspirin 325 mg daily.  2.  Lopressor 25 mg twice a day.  3.  Lipitor 40 mg daily.  4.  Plavix 75 mg daily. 5.  Levothyroxine 75 mcg daily.  6.   Multivitamin plus vitamin D 500 mg daily.  7.  Alprazolam 0.25 mg p.r.n.  8.  Vitamin E 400 international units daily.  9.  Ultram 50 mg 1-2 q.4-6 hours p.r.n. pain.   ACTIVITY:  No driving.  No lifting more than 10 pounds.  The patient should  continue daily walking exercises.   DIET:  Low-salt, low-fat and carbohydrate modified  medium calorie.   WOUND CARE:  The patient may shower daily and clean her incisions with  soap  and water.  If wound problems arise she should contact the CVTS office.   FOLLOW UP:  The patient has a follow up appointment for staple removal on  January 21, 2005 at 10 A.M. with the CVTS R. N.  The patient has been  instructed to contact Dr. Yevonne Pax office in two weeks for a follow up  appointment.  Appointment with Dr. Laneta Simmers on February 08, 2005 at 11:15 A.M.  The patient will be instructed to go to the [facility] one  hour prior to appointment with Dr. Laneta Simmers for a PA and lateral chest x-ray.      Pecola Leisure, [place]      Evelene Croon, M.D.  Electronically Signed    AY/MEDQ  D:  01/14/2005  T:  01/15/2005  Job:  [id number]   cc:   Cassell Clement, M.D.  Fax: [phone]

## 2010-08-20 NOTE — H&P (Signed)
[name], [name] NO.:  [id number]   MEDICAL RECORD NO.:  [id number]          PATIENT TYPE:  EMS   LOCATION:  MAJO                         FACILITY:  [facility]   PHYSICIAN:  Georga Hacking, M.D.DATE OF BIRTH:  1917-02-10   DATE OF ADMISSION:  01/06/2005  DATE OF DISCHARGE:                                HISTORY & PHYSICAL   REASON FOR ADMISSION:  Chest discomfort.   HISTORY:  This 77 year old female is admitted as an emergency patient for  evaluation of chest pain.  The patient has a longstanding history of  hypertension and hyperlipidemia and had a stroke several years ago, at which  time she was treated with Plavix.  She was recently seen by Dr. Nathanial Rancher  about 2 weeks ago, at which time she had a crazy feeling in her chest,  radiating to the right upper arm, lasting 1-3 minutes.  It was not pain or  pressure, not associated with palpitations.  It was not exertional.  She  refused stress testing at that time.  She has had 3 severe episodes,  however, since then, the last occurring on Friday when she had a maybe 3-4  hour episode of bilateral arm numbness and heaviness associated with  shortness of breath.  She had a second episode in the past several days  lasting several hours and then was awake all evening last night with similar  symptoms.  She was seen at Dr. Miguel Rota office today and was sent to the  [facility] Emergency Room.  She has an abnormal baseline EKG with what  appears to be an old anteroseptal infarction as well as ST depression in the  lateral leads.  She is not currently having any symptoms.  She has been  under significant situational stress with her husband, who has had recent  stenting of a vertebral artery.  She still is able to live at home with her  husband and can take care of her things at home.  She does have a history of  anxiety and depression.   PAST HISTORY:  1.  She has longstanding hypertension as well as hyperlipidemia.  2.  She has a history of diverticulosis, but no history of GI bleeding.  3.  There is no history of diabetes, but has had an elevated blood sugar in      the past.   PREVIOUS SURGERY:  Hysterectomy.   ALLERGIES:  None.   CURRENT MEDICATIONS:  1.  Doxazosin 8 mg at bedtime.  2.  Toprol XL 100 mg daily.  3.  Lipitor 20 mg daily.  4.  Levothroid 0.075 mg daily.  5.  Plavix 75 mg daily.  6.  Baby aspirin 81 mg daily.  7.  Calcium.  8.  Multivitamins.  9.  She previously had been on Fosamax weekly.  10. Also has been on alprazolam for anxiety.   SOCIAL HISTORY:  She has three daughters and a son.  Lives with her husband.  Currently maintains her home independently.  Does not smoke or use alcohol  to excess.  Attends [employer].   REVIEW OF SYSTEMS:  She has lost a mild amount of weight.  She has had  bilateral cataract extraction, but no glaucoma.  No difficulty hearing, no  difficulty swallowing.  Does have a history of diverticulosis by CT scan  previously.  No urinary complaints.  She has no dyspnea, cough, or wheezing.  No pneumonia or hemoptysis.  She does have some mild arthritis.  Does have a  previous history of a stroke, and has had residual numbness involving her  right hand.  She has easy bruisability.  She has no allergies, and no  history of cancer.   PHYSICAL EXAMINATION:  GENERAL:  She is a pleasant woman who is currently in  no acute distress.  VITAL SIGNS:  Blood pressure 138/64; pulse oximetry 94%; temperature 97.7.  SKIN:  Warm and dry, without obvious mass or lesion.  ENT:  EOMI.  PERRLA.  CNS Clear.  Funduscopic examination was not performed.  Pharynx negative.  NECK:  Supple, without masses, JVD, thyromegaly, or bruits.  LUNGS:  Clear to A&P.  CARDIOVASCULAR:  Shows normal S1 and S2.  There was no S3, S4, or murmur.  ABDOMEN:  Soft, nontender.  No hepatosplenomegaly, mass, or aneurysm.  EXTREMITIES:  Femoral pulses are 2+.  Distal  pulses are 1-2+.  There is no  edema noted.   EKG shows a previous anterior infarction and ST depression in the lateral  leads.   Laboratory data shows potassium 3.9, random glucose 118.  Point of care  enzymes showed troponin 0.1, but normal CPK-MB and normal myoglobin.  CBC  was normal.   IMPRESSION:  1.  Chest pain suggestive of unstable angina pectoris, with an abnormal      baseline EKG.  2.  Hypertension.  3.  Hyperlipidemia.  4.  Previous stroke.  5.  Situational stress and anxiety.  6.  Hypothyroidism, under treatment.  7.  Osteoporosis by history.  8.  History of diverticulosis.   RECOMMENDATIONS:  The patient is admitted to a telemetry bed and will be  placed on Lovenox and IV nitroglycerin.  She will be continued on her other  medicines, and Norvasc will be added to her regimen.  At this point, with  progressive symptoms likely, it would be best to proceed with cardiac  catheterization.  However, at this time she is uncertain as to what she  wants to do.  I will plan to see her back as she decides what she wants to  do following further medical workup, rule out myocardial infarction.      Georga Hacking, M.D.  Electronically Signed     WST/MEDQ  D:  01/06/2005  T:  01/06/2005  Job:  [id number]   cc:   Laretta Bolster, M.D.  [facility]

## 2010-08-20 NOTE — Cardiovascular Report (Signed)
NAMEAHRIA, SLAPPEY NO.:  [id number]   MEDICAL RECORD NO.:  [id number]          PATIENT TYPE:  INP   LOCATION:  3704                         FACILITY:  [facility]   PHYSICIAN:  Georga Hacking, M.D.DATE OF BIRTH:  03/07/1917   DATE OF PROCEDURE:  01/07/2005  DATE OF DISCHARGE:                              CARDIAC CATHETERIZATION   HISTORY:  An 77 year old female who presented with congestive heart failure  and chest pain consistent with unstable angina pectoris. Abnormal cardiac  enzymes were noted and elevated BNP.   PROCEDURE:  Left heart catheterization with coronary angiograms and left  ventriculogram.   PROCEDURE:  The procedure was done through the right femoral artery  percutaneously with a single anterior needle wall stick. There was some  resistance encountered to passing the wire up and the procedure was done  with a wire exchange technique. A 25 cc ventriculogram was performed and the  sheath was removed. She was returned to her room in stable condition.   HEMODYNAMIC DATA:  Aorta postcontrast 139/62, LV postcontrast 139/14-22.   ANGIOGRAPHIC DATA:  Left ventriculogram: Performed in the 30 degrees RAO  projection. The aortic valve is mildly calcified but opens normally. There  was no aortic valve gradient. Mitral valve has 1 to 2+ mitral regurgitation  noted. The left ventricle has mild diffuse hypokinesis noted with an  estimated ejection fraction of 40-45%. Coronary arteries were heavily  calcified, arise and distribute normally. The left main coronary is  calcified. There is mild ventricularization of the wave form with moderate  diffuse narrowing of the left main. There appears to be a distal 70%  stenosis noted. The LAD is a large vessel and it may have thrombus at the  beginning of it. The vessel is heavily calcified and diffusely diseased.  There is a severe 90% mid vessel stenosis noted after the diagonal branch.  The diagonal branch has a  70% proximal stenosis. The circumflex coronary  artery is subtotally occluded proximally and fills by left to left  collaterals. The right coronary artery is occluded after the conus branch  and fills by well-developed system of left collaterals. Filling of the RV  branch is seen through the conus as well as from the LAD and through the  septal perforator system. The vessel were not amendable to percutaneous  intervention.   IMPRESSION:  1.  Severe left main and three-vessel coronary artery disease.  2.  Abnormal left ventricular function with mildly depressed left      ventricular is estimated ejection fraction of 40-45%.   RECOMMENDATIONS:  Consideration of bypass grafting if she is agreeable.  Otherwise, her prognosis is extremely poor.      Georga Hacking, M.D.  Electronically Signed     WST/MEDQ  D:  01/07/2005  T:  01/07/2005  Job:  [id number]   cc:   Burnell Blanks, M.D.  [facility]

## 2010-08-20 NOTE — Consult Note (Signed)
NAMEKALI, DEADWYLER                 ACCOUNT NO.:  [id number]   MEDICAL RECORD NO.:  [id number]          PATIENT TYPE:  INP   LOCATION:  6737                         FACILITY:  [facility]   PHYSICIAN:  Hind I Elsaid, MD      DATE OF BIRTH:  [birth year]   DATE OF CONSULTATION:  04/05/2006  DATE OF DISCHARGE:                                 CONSULTATION   CARDIOLOGIST:  Evelene Croon, M.D.   CONSULTATIONS:  Orthopedics, Dr. Carola Frost.   REASON FOR CONSULTATION:  Medical management and evaluation of  dizziness.   HISTORY OF PRESENT ILLNESS:  [age]-year-old, pleasant female with  a past medical history of coronary artery disease, status post coronary  artery bypass graft in 2006, history of hypertension, history of  osteoporosis.  Patient had a history of multiple falls before in the  past.  In June 2007 the patient has a history of syncope.  She is status  post admission to [facility] and cardiology workup which was done and,  according to the patient and family, everything was found negative.  The  patient, today, went to pick up her mail from the mailbox and, as she  got the mail, she turned and started to go back to her home.  She felt  kind of dizzy, lasting a few seconds.  Patient cannot decide if there  was any spinning around her or she was spinning, but she admitted that  she lost balance and she fell, ending up with pelvic and hip pain.  She  was able to walk after the fall.  Denies any head injuries.  She  continued to complain of pelvic pain and hip pain.  Is status post  admission to [facility] for further evaluation.  Patient  admitted since June when she started to feel loss of imbalance and  scared of falling.  At this time she denies any history of the room  spinning around or herself spinning.  She denies any chest pain or  shortness of breath associated with falls.  Patient denies any roaring  in her ears, denies any hearing loss and denies any weakness  in her  lower extremities or numbness of the extremities.  According to the  patient, she was recently worked up after the syncopal episode.  Cardiology workup was negative and she was sent home on physical  therapy, but physical therapy did not continue as Medicare did not  approve it.   SYSTEMIC REVIEW:  Patient denies any fever, denies any headache, denies  any cough.  No shortness of breath.  No chest pain.  No vomiting.  No  hematemesis.  Normal bowel habits.  She feels pain around the right  pelvic area and hip area.  Denies any lower extremity swelling.  Denies  any history of dizziness with changing head position or with changing  position.   PAST MEDICAL HISTORY:  1. History of coronary artery disease status post bypass graft in      2006.  2. History of hypertension.  3. History of dyslipidemia.  4. Osteoporosis.  5. Hypothyroidism.  6. History of diverticulosis.  7. History of diabetes mellitus.  8. History of CVA.  9. Anxiety and depression.   PAST SURGICAL HISTORY:  1. History of bypass and hysterectomy.  2. History of hernia repair.   MEDICATIONS:  1. Aspirin 81 mg p.o. daily.  2. Metoprolol 25 mg p.o. b.i.d.  3. Levothyroxine 25 mg daily.  4. Fosamax.  5. Calcium.  6. Vitamin D.  7. Patient admitted she was on Plavix which was recently discontinued      by the cardiologist as the patient has a high risk of falls.   SOCIAL HISTORY:  She lives with her husband.  No history of smoking.  No  history of drinking.  No history of drug abuse.   ALLERGIES:  ALLERGY TO SULFA.   PHYSICAL EXAMINATION:  VITAL SIGNS:  Temperature 97.4, blood pressure  179/63.  No evidence of orthostatic documents in the emergency room.  Pulse rate 73, respiratory rate 18, saturations 96% on room air.  HEENT:  No JVP.  Extraocular muscle movement is within normal.  Ears,  nose and throat was normal.  NECK:  Supple.  No JVD.  No thyromegaly.  CHEST:  Normal vesicular breathing.   Equal air entry.  HEART:  S1 and S2.  No other sounds.  ABDOMEN:  Soft and nontender.  Bowel sounds positive.  PELVIC AREA:  There is some pelvic tenderness to palpation from  bilateral hips, lower extremities, right hip tenderness to palpation.  EXTREMITIES:  There is no lower limb edema and peripheral pulses are  intact.  CNS:  Patient alert and oriented x3.  Can move all extremities but some  weakness in the right hip related to pain.  No sensory loss.  Gait  cannot be evaluated.  SKIN:  There is mild abrasion of the right elbow.   LABORATORY DATA:  EKG:  The new EKG, there is sinus bradycardia.  Compared to the EKG on October 10, there is some kind of ST depression  only 2-3 aVF.  Patient denies any pain at this time.   Lab wise urinalysis was negative.  INR 0.9, PTT 25, white blood cells  18.1, hemoglobin 13 and hematocrit 37.4 with platelets of 171.  CT of  the pelvis noted fracture of the right superior ramus.  Chest x-ray  reveals left effusion, left lower lobe atelectasis.  Hip x-ray of remote  right superior ramus healed fracture with deformity.  No displaced  fracture or malalignment.   ASSESSMENT AND PLAN:  1. For dizziness, patient cannot clarify at this time if she has      dizziness or loss of balance, but she continues to admit that her      problem is mainly loss of balance and disequilibrium, so we will      admit the patient to orthopedics with telemetry monitor and repeat      EKG in the morning.  CK-MB and troponins and 2-D echo and carotid      Dopplers and will continue with aspirin and low dose beta blocker.      We will consider cardiology consult if indicated.  We also will      check the CT scan of the head.  The patient may really need      physical therapy and pain control at this time.  Also, we will      check orthostatic blood pressures. 2. Leukocytosis possibly due to stress.  Will repeat urinalysis and  culture and will repeat white blood cells  in the morning.  3. Uncontrolled hypertension.  Will add lisinopril 10 mg p.o. daily      for patient's medications.  4. Right hip/right pelvic fracture, CT scan shows comminuted fracture      as well also and pain control.  5. Osteoporosis.  Continue with the Fosamax, the vitamin D and      calcium.  6. Coronary artery disease.  Status post CABG.  To continue on      aspirin, beta blocker and ACE inhibitor.  7. Mild EKG changes, probably due to sinus bradycardia.  We will      repeat an EKG in the morning.  On my evaluation there is sinus      bradycardia with some ST depression only 2-3 aVF.  Could be due to      the bradycardia.  Repeat EKG in the morning and CK-MB and troponins      for further evaluation.  Cardiology consult may be considered.  8. DVT and GI prophylaxis.      Hind Bosie Helper, MD  Electronically Signed     HIE/MEDQ  D:  04/05/2006  T:  04/06/2006  Job:  [id number]

## 2010-08-20 NOTE — Op Note (Signed)
[name], [name] NO.:  [id number]   MEDICAL RECORD NO.:  [id number]          PATIENT TYPE:  INP   LOCATION:  2305                         FACILITY:  [facility]   PHYSICIAN:  Evelene Croon, M.D.     DATE OF BIRTH:  1917-03-24   DATE OF PROCEDURE:  01/10/2005  DATE OF DISCHARGE:                                 OPERATIVE REPORT   PREOPERATIVE DIAGNOSIS:  Left main and severe three-vessel coronary artery  disease.   POSTOPERATIVE DIAGNOSIS:  Left main and severe three-vessel coronary artery  disease.   OPERATIVE PROCEDURE:  Median sternotomy, extracorporeal circulation,  coronary bypass graft surgery x4 using a left internal mammary artery graft  to the left anterior descending coronary artery, with a sequential saphenous  vein graft to the first diagonal branch of the LAD and the obtuse marginal  branch of the left circumflex coronary, and a saphenous vein graft to the  right coronary, and right coronary artery endarterectomy.  Endoscopic vein  harvesting from the left leg.   ATTENDING SURGEON:  Evelene Croon, M.D.   ASSISTANT:  Coral Ceo, P.A.   ANESTHESIA:  General endotracheal.   CLINICAL HISTORY:  This patient is an 77 year old woman who is independent  and in fairly good overall health, who was admitted with recurrent episodes  of substernal chest pain radiating into her arms with numbness and shortness  of breath.  Cardiac catheterization showed about 50% left main stenosis.  There was a 90% proximal LAD stenosis with thrombus present.  There was a  70% first diagonal stenosis.  The left circumflex was occluded with filling  of the obtuse marginal by collaterals from the left.  The right coronary  artery was occluded with filling of the distal vessel by collaterals from  the left.  Left ventricular ejection fraction by catheterization was about  35% with 2+ mitral regurgitation.  An echocardiogram was performed which  showed mild-to-moderate mitral  regurgitation with an ejection fraction that  was slightly better at about 40% to 45%.  After review of the angiogram and  examination the patient, it was felt that coronary artery bypass graft  surgery the best treatment.  I discussed operative procedure with the  patient and her 2 daughter including alternatives, benefits and risks  including bleeding, blood transfusion, infection, stroke, myocardial  infarction, graft failure, and death.  I also discussed the increased risk,  given her age, but there were no other therapeutic options available to her.  They understood and wanted to proceed with surgery.   OPERATIVE PROCEDURE:  The patient was taken to the operating room and placed  on the table in a supine position.  After induction of general endotracheal  anesthesia, a Foley catheter was placed in the bladder using a sterile  technique.  Then the chest, abdomen and both lower extremities were prepped  and draped in usual sterile manner.  The chest was entered through a median  sternotomy incision and the pericardium opened in the midline.  Examination  of the heart showed good ventricular function and contractility.  The  ascending aorta  had significant calcification of the aortic root and also  the distal ascending aorta near the takeoff of the innominate artery out  into the arch.  There was an area in the midportion of the ascending aorta  which did not have any palpable plaque in it.   Then a transesophageal echocardiogram was performed by Anesthesiology.  This  showed mild mitral regurgitation with a structurally intact mitral valve  with mild leaflet thickening.  There was no aortic insufficiency.  Left  ventricular function appeared to mildly decreased with an ejection fraction  about 50%.  Pulmonary artery pressures were within normal limits.   Then the left internal mammary artery was harvested from the chest was a  pedicle graft.  This was a medium-caliber vessel with  excellent blood flow  through it.  At the same time, a segment of greater saphenous vein was  harvested from the left leg using endoscopic vein harvest technique.  This  vein was of medium size and good quality.  We initially examined the vein  adjacent to the right knee and it was very small and unsuitable.   Then the patient was heparinized and when an adequate activated clotting  time was achieved, the distal ascending aorta was cannulated using a 20-  [place] aortic cannula for arterial inflow.  Venous outflow was achieved  using a two-stage venous cannula through the right atrial appendage.  An  antegrade cardioplegia and vent cannula was inserted in the aortic root.   The patient was placed on cardiopulmonary bypass and the distal coronaries  identified.  She had diffuse calcific coronary artery disease.  The aorta  was then crossclamped and 500 mL of cold blood antegrade cardioplegia were  administered in the aortic root with quick arrest the heart.  Systemic  hypothermia to 20 degrees centigrade and topical hypothermic with iced  saline was used.  A temperature probe was placed in septum and an insulating  pad in the pericardium.   The first distal anastomosis was performed to the first diagonal branch.  The internal diameter of about 1.5 mm.  The conduit that was used was a  segment of greater saphenous vein and the anastomosis performed in a  sequential side-to-side manner using a continuous 7-0 Prolene.  Flow was  noted through the graft and was excellent.   The second distal anastomosis was performed to the obtuse marginal branch.  The internal diameter was about 1.6 mm.  The conduit that was used was the  same segment of greater saphenous vein and the anastomosis performed in a  sequential end-to-side manner using a continuous 7-0 Prolene suture.  Flow  was measured through the graft and was excellent.   Then another dose cardioplegia was given down vein grafts and in the  aortic root.  Examination of the distal right coronary artery showed that there was  a small posterior descending and several tiny posterolateral branches.  None  of these appeared large enough to graft directly.  The distal right coronary  artery before the take off the posterior descending branch was diffusely  calcified with plaque.  This plaque extended backwards up the right coronary  artery into its proximal portion.  I felt that the only way to graft the  right coronary artery would be to perform an endarterectomy.  Then the  distal right coronary was incised longitudinally over a length of about 0.5  cm.  The concentric calcified plaque was carefully immobilized from the  adventitia using the endarterectomy  instruments.  Then the plaque was  divided and with traction on the distal portion of plaque, it was dissected  from the vessel wall extending distally using the endarterectomy  instruments.  This plaque was removed with a nice feathered edge distally.  Then the proximal end of the plaque was retracted and dissection performed  using the endarterectomy instruments, and the plaque was dissected free back  about 1.5 cm.  This plaque was then divided using scissors.  The  endarterectomy site looked intact.  Then a saphenous vein graft was  performed to the distal right coronary at the endarterectomy site in an end-  to-side manner using a continuous 7-0 Prolene suture.  Flow was measured  through this graft and was excellent.  There was complete hemostasis.  Then  another dose of cardioplegia was given down the vein grafts and in the  aortic root.   The fourth distal anastomosis was performed to the distal portion of the  left anterior descending coronary.  The internal diameter of about 1.6 mm.  The conduit that was used was the left internal mammary graft and this was  brought through an opening in the left pericardium anterior to the phrenic  nerve.  It was anastomosed to the  LAD in an end-to-side manner using  continuous 8-0 Prolene suture.  The pedicle was suture to the epicardium  with 6-0 Prolene sutures.  The patient was then rewarmed to 37 degrees  centigrade.  The 2 proximal vein graft anastomoses were performed to the  aortic root in an end-to-side manner using continuous a 6-0 Prolene suture.  The clamp was then removed from mammary artery pedicle.  There was rapid  rewarming of the ventricular septum and return of spontaneous ventricular  fibrillation.  The crossclamp was removed with a time of 86 minutes and  patient spontaneously converted to sinus rhythm.   The proximal and distal anastomoses appeared hemostatic and the lines of the  grafts satisfactory.  Graft markers were placed around the proximal  anastomoses.  Two temporary right ventricular and right atrial pacing wires  were placed and brought out through the skin.   When the patient had rewarmed to 37 degrees centigrade, she was weaned from cardiopulmonary bypass on renal dose dopamine.  Total bypass time was 102  minutes.  Transesophageal echocardiogram was again performed and showed well-  preserved left ventricular function with an ejection fraction of about 50%.  There was still mild mitral regurgitation.  Protamine was then given and the  venous and aortic cannulas were removed without difficulty.  Hemostasis was  achieved.  The patient was given 10 units of platelets and she had been on  aspirin and Plavix preoperatively.  Hemostasis was achieved without  difficulty.  Three chest tubes were placed with a tube in the posterior  pericardium, 1 in the left pleural space and 1 in the anterior mediastinum.  The pericardium was not closed over the heart.  The sternum was closed #6  stainless steel wires.  Fascia was closed with continuous #1 Vicryl suture.  Subcutaneous tissue was closed with a continuous 2-0 Vicryl and the skin  with 3-0 Vicryl subcuticular closure.  The lower extremity  vein harvest site  was closed in layers in a similar manner.  The sponge, needle and instrument  counts were correct according to the scrub nurse.  Dry sterile dressings  were applied over the incisions and around the chest tubes, which were  hooked to Pleur-evac suction.  The  patient remained hemodynamically stable  and was transported to the SICU in guarded, but stable condition.      Evelene Croon, M.D.  Electronically Signed     BB/MEDQ  D:  01/10/2005  T:  01/11/2005  Job:  [id number]   cc:   Georga Hacking, M.D.  Fax: [phone]  Email: [email or web address] .[facility]

## 2010-10-30 ENCOUNTER — Emergency Department (INDEPENDENT_AMBULATORY_CARE_PROVIDER_SITE_OTHER): Payer: Medicare Other

## 2010-10-30 ENCOUNTER — Encounter: Payer: Self-pay | Admitting: *Deleted

## 2010-10-30 ENCOUNTER — Emergency Department (HOSPITAL_BASED_OUTPATIENT_CLINIC_OR_DEPARTMENT_OTHER)
Admission: EM | Admit: 2010-10-30 | Discharge: 2010-10-31 | Disposition: A | Discharge status: Other Acute Inpatient Hospital | Payer: Medicare Other | Source: Home / Self Care | Attending: Emergency Medicine | Admitting: Emergency Medicine

## 2010-10-30 DIAGNOSIS — M25559 Pain in unspecified hip: Secondary | ICD-10-CM

## 2010-10-30 DIAGNOSIS — W19XXXA Unspecified fall, initial encounter: Secondary | ICD-10-CM

## 2010-10-30 DIAGNOSIS — I4891 Unspecified atrial fibrillation: Secondary | ICD-10-CM

## 2010-10-30 DIAGNOSIS — I517 Cardiomegaly: Secondary | ICD-10-CM

## 2010-10-30 DIAGNOSIS — M899 Disorder of bone, unspecified: Secondary | ICD-10-CM

## 2010-10-30 DIAGNOSIS — M79609 Pain in unspecified limb: Secondary | ICD-10-CM

## 2010-10-30 DIAGNOSIS — J438 Other emphysema: Secondary | ICD-10-CM

## 2010-10-30 DIAGNOSIS — R1011 Right upper quadrant pain: Secondary | ICD-10-CM

## 2010-10-30 DIAGNOSIS — K838 Other specified diseases of biliary tract: Secondary | ICD-10-CM

## 2010-10-30 DIAGNOSIS — S62309A Unspecified fracture of unspecified metacarpal bone, initial encounter for closed fracture: Secondary | ICD-10-CM

## 2010-10-30 DIAGNOSIS — M949 Disorder of cartilage, unspecified: Secondary | ICD-10-CM

## 2010-10-30 DIAGNOSIS — Z9181 History of falling: Secondary | ICD-10-CM

## 2010-10-30 DIAGNOSIS — R0789 Other chest pain: Secondary | ICD-10-CM

## 2010-10-30 DIAGNOSIS — S62509A Fracture of unspecified phalanx of unspecified thumb, initial encounter for closed fracture: Secondary | ICD-10-CM

## 2010-10-30 HISTORY — DX: Essential (primary) hypertension: I10

## 2010-10-30 HISTORY — DX: Pure hypercholesterolemia, unspecified: E78.00

## 2010-10-30 LAB — DIFFERENTIAL
Basophils Absolute: 0 10*3/uL (ref 0.0–0.1)
Basophils Relative: 0 % (ref 0–1)
Lymphocytes Relative: 20 % (ref 12–46)
Monocytes Relative: 11 % (ref 3–12)
Neutro Abs: 4.4 10*3/uL (ref 1.7–7.7)
Neutrophils Relative %: 65 % (ref 43–77)

## 2010-10-30 LAB — APTT: aPTT: 25 seconds (ref 24–37)

## 2010-10-30 LAB — URINE MICROSCOPIC-ADD ON

## 2010-10-30 LAB — COMPREHENSIVE METABOLIC PANEL
ALT: 10 U/L (ref 0–35)
AST: 18 U/L (ref 0–37)
Albumin: 3.4 g/dL — ABNORMAL LOW (ref 3.5–5.2)
Alkaline Phosphatase: 65 U/L (ref 39–117)
Potassium: 4.4 mEq/L (ref 3.5–5.1)
Sodium: 144 mEq/L (ref 135–145)
Total Protein: 7.3 g/dL (ref 6.0–8.3)

## 2010-10-30 LAB — CBC
Hemoglobin: 10.9 g/dL — ABNORMAL LOW (ref 12.0–15.0)
MCHC: 33.4 g/dL (ref 30.0–36.0)
RDW: 14.1 % (ref 11.5–15.5)
WBC: 6.8 10*3/uL (ref 4.0–10.5)

## 2010-10-30 LAB — URINALYSIS, ROUTINE W REFLEX MICROSCOPIC
Nitrite: NEGATIVE
Specific Gravity, Urine: 1.018 (ref 1.005–1.030)
Urobilinogen, UA: 0.2 mg/dL (ref 0.0–1.0)
pH: 6.5 (ref 5.0–8.0)

## 2010-10-30 MED ORDER — IOHEXOL 300 MG/ML  SOLN
100.0000 mL | Freq: Once | INTRAMUSCULAR | Status: AC | PRN
  Administered 2010-10-30: 100 mL via INTRAVENOUS

## 2010-10-30 MED ORDER — SODIUM CHLORIDE 0.9 % IV SOLN: Freq: Once | INTRAVENOUS | Status: DC

## 2010-10-30 NOTE — ED Notes (Signed)
Pt states she was trying to get some towels up off the floor on [address] and fell. Got light-headed. Now c/o pain to right rib, hip and thumb area. Some SHOB. Irregular HR.

## 2010-10-30 NOTE — ED Provider Notes (Addendum)
History     Chief Complaint  Patient presents with  . Fall   Patient is a 77 y.o. female presenting with fall.  Fall The accident occurred more than 2 days ago. She landed on a hard floor. The pain is moderate. Associated symptoms include abdominal pain. Pertinent negatives include no fever, no nausea, no hematuria and no headaches.  patient states that on Wednesday she was picking up some wet towels and she got dizzy and fell over. She states that it is somewhat normal to get dizzy after her bending over. Since then she has had right abdominal pain, pain in her right thumb, and pain in her left chest. No dyspnea. No fevers. No numbness or weakness. She has been having more difficulty walking.   Past Medical History  Diagnosis Date  . Hypertension   . Hypercholesteremia     Past Surgical History  Procedure Date  . Appendectomy   . Coronary artery bypass graft     History reviewed. No pertinent family history.  History  Substance Use Topics  . Smoking status: Never Smoker   . Smokeless tobacco: Not on file  . Alcohol Use: No    OB History    Grav Para Term Preterm Abortions TAB SAB Ect Mult Living                  Review of Systems  Constitutional: Positive for activity change and fatigue. Negative for fever.  HENT: Negative for facial swelling and neck pain.   Respiratory: Negative for cough, chest tightness and shortness of breath.   Cardiovascular: Positive for chest pain. Negative for leg swelling.  Gastrointestinal: Positive for abdominal pain. Negative for nausea, diarrhea and constipation.  Genitourinary: Negative for hematuria and flank pain.  Musculoskeletal: Negative for back pain.  Skin: Negative for rash.  Neurological: Negative for seizures and headaches.  Psychiatric/Behavioral: Negative for confusion.    Physical Exam  BP 104/77  Pulse 92  Temp(Src) 98.9 F (37.2 C) (Oral)  Resp 24  Ht 5\' 5"  (1.651 m)  Wt 104 lb (47.174 kg)  BMI 17.31 kg/m2   SpO2 96%  Physical Exam  Nursing note and vitals reviewed. Constitutional: She appears well-developed and well-nourished.  HENT:  Head: Normocephalic and atraumatic.  Eyes: Pupils are equal, round, and reactive to light.  Neck: Normal range of motion. Neck supple.  Cardiovascular: Intact distal pulses.  An irregularly irregular rhythm present. Tachycardia present.        No MRG  Pulmonary/Chest: Effort normal.       Mild tenderness right chest wall without crepitance.   Abdominal: She exhibits no distension and no mass. There is tenderness. There is no rebound and no guarding.       Moderate tenderness RUQ. No bruising.   Musculoskeletal:       Tender over right thumb at MCP joint. Bruising. Skin intact. Mild pain in right hip with paplation.   Neurological: She is alert. No cranial nerve deficit.  Skin: Skin is warm. No rash noted. No erythema.  Psychiatric: She has a normal mood and affect.    ED Course  Procedures   Date: 10/30/2010  Rate: 100  Rhythm: atrial fibrillation  QRS Axis: normal  Intervals: atrial fibrilation  ST/T Wave abnormalities: normal  Conduction Disutrbances:atrial fibrilation  Narrative Interpretation:   Old EKG Reviewed: changes noted afib is new   MDM Fall 2 days ago with dizziness with standing. Right UQ tenderness. CT showed dilated CBD. Also right thumb fracture.  New onset afib with rate mostly controlled. Admit to [place].   Results for orders placed during the hospital encounter of 10/30/10  CBC      Component Value Range   WBC 6.8  4.0 - 10.5 (K/uL)   RBC 3.53 (*) 3.87 - 5.11 (MIL/uL)   Hemoglobin 10.9 (*) 12.0 - 15.0 (g/dL)   HCT 16.1 (*) 09.6 - 46.0 (%)   MCV 92.4  78.0 - 100.0 (fL)   MCH 30.9  26.0 - 34.0 (pg)   MCHC 33.4  30.0 - 36.0 (g/dL)   RDW 04.5  40.9 - 81.1 (%)   Platelets 188  150 - 400 (K/uL)  DIFFERENTIAL      Component Value Range   Neutrophils Relative 65  43 - 77 (%)   Neutro Abs 4.4  1.7 - 7.7 (K/uL)    Lymphocytes Relative 20  12 - 46 (%)   Lymphs Abs 1.4  0.7 - 4.0 (K/uL)   Monocytes Relative 11  3 - 12 (%)   Monocytes Absolute 0.8  0.1 - 1.0 (K/uL)   Eosinophils Relative 3  0 - 5 (%)   Eosinophils Absolute 0.2  0.0 - 0.7 (K/uL)   Basophils Relative 0  0 - 1 (%)   Basophils Absolute 0.0  0.0 - 0.1 (K/uL)  URINALYSIS, ROUTINE W REFLEX MICROSCOPIC      Component Value Range   Color, Urine YELLOW  YELLOW    Appearance CLOUDY (*) CLEAR    Specific Gravity, Urine 1.018  1.005 - 1.030    pH 6.5  5.0 - 8.0    Glucose, UA NEGATIVE  NEGATIVE (mg/dL)   Hgb urine dipstick NEGATIVE  NEGATIVE    Bilirubin Urine NEGATIVE  NEGATIVE    Ketones, ur NEGATIVE  NEGATIVE (mg/dL)   Protein, ur NEGATIVE  NEGATIVE (mg/dL)   Urobilinogen, UA 0.2  0.0 - 1.0 (mg/dL)   Nitrite NEGATIVE  NEGATIVE    Leukocytes, UA SMALL (*) NEGATIVE   COMPREHENSIVE METABOLIC PANEL      Component Value Range   Sodium 144  135 - 145 (mEq/L)   Potassium 4.4  3.5 - 5.1 (mEq/L)   Chloride 106  96 - 112 (mEq/L)   CO2 27  19 - 32 (mEq/L)   Glucose, Bld 130 (*) 70 - 99 (mg/dL)   BUN 29 (*) 6 - 23 (mg/dL)   Creatinine, Ser 9.14  0.50 - 1.10 (mg/dL)   Calcium 78.2  8.4 - 10.5 (mg/dL)   Total Protein 7.3  6.0 - 8.3 (g/dL)   Albumin 3.4 (*) 3.5 - 5.2 (g/dL)   AST 18  0 - 37 (U/L)   ALT 10  0 - 35 (U/L)   Alkaline Phosphatase 65  39 - 117 (U/L)   Total Bilirubin 0.3  0.3 - 1.2 (mg/dL)   GFR calc non Af Amer 52 (*) >60 (mL/min)   GFR calc Af Amer >60  >60 (mL/min)  PROTIME-INR      Component Value Range   Prothrombin Time 12.8  11.6 - 15.2 (seconds)   INR 0.94  0.00 - 1.49   TROPONIN I      Component Value Range   Troponin I <0.30  <0.30 (ng/mL)  APTT      Component Value Range   aPTT 25  24 - 37 (seconds)  URINE MICROSCOPIC-ADD ON      Component Value Range   Squamous Epithelial / LPF FEW (*) RARE    WBC, UA 3-6  <3 (  WBC/hpf)   Bacteria, UA MANY (*) RARE    Casts HYALINE CASTS (*) NEGATIVE    Dg Ribs Unilateral  W/chest Right  10/30/2010  *RADIOLOGY REPORT*  Clinical Data: The patient fell.  Right chest pain.  RIGHT RIBS AND CHEST - 3+ VIEW  Comparison: 04/05/2009  Findings: Hyperexpansion is consistent with emphysema. The cardiopericardial silhouette is enlarged. Interstitial markings are diffusely coarsened with chronic features. Bones are diffusely demineralized.  Old left clavicle fracture again noted.  Deformity of the left humeral head suggests prior trauma.  Oblique views of the right ribs show multiple nonacute rib fractures.  No displaced acute right-sided rib fracture is evident. No evidence for right pneumothorax.  No right pleural effusion.  IMPRESSION: Multiple old right rib fractures again noted.  No acute displaced right rib fracture is evident today.  Emphysema with cardiomegaly.  No evidence for pneumothorax or pleural effusion.  Original Report Authenticated By: ERIC A. MANSELL, M.D.   Dg Hip Complete Right  10/30/2010  *RADIOLOGY REPORT*  Clinical Data: Right hip pain after fall.  RIGHT HIP - COMPLETE 2+ VIEW  Comparison: 12/23/2007  Findings: Contrast material in the ureters and bladder is consistent with the the patient having had a CT scan earlier today. Bones are diffusely demineralized.  Old right superior and inferior pubic rami fractures are again noted.  SI joints and symphysis pubis are unremarkable.  AP and frog-leg lateral views of the right hip show no evidence for a proximal femur fracture.  IMPRESSION: No acute bony findings.  Original Report Authenticated By: ERIC A. MANSELL, M.D.   Ct Abdomen Pelvis W Contrast  10/30/2010  *RADIOLOGY REPORT*  Clinical Data: Right upper quadrant pain.  Status post fall.  CT ABDOMEN AND PELVIS WITH CONTRAST  Technique:  Multidetector CT imaging of the abdomen and pelvis was performed following the standard protocol during bolus administration of intravenous contrast.  Contrast: 100 ml of Omnipaque-300  Comparison: None  Findings: Plate-like  atelectasis noted within the lung bases.  No pericardial or pleural effusion.  The spleen is normal.  The adrenal glands are normal.  There are no focal liver abnormalities identified.  Gallbladder appears normal.  The common bile duct appears increased in caliber measuring up to 9.7 mm.  There is an indeterminant intermediate to low density heterogeneous filling defect within the distal portion of the common bile duct in the region of the pancreatic head.  This measures 1.3 x 1.4 cm, image number 28. This results an abrupt cutoff of the dilated common bile duct.  There is no pancreatic duct obstruction.  The body and tail of the pancreas have a normal appearance.  Both adrenal glands appear normal.  The kidneys are normal.  No enlarged upper abdominal lymph nodes.  No pelvic or inguinal adenopathy.  Urinary bladder is normal.  The stomach and the small bowel loops have a normal course and caliber.  Colon is normal.  Moderate amount of desiccated stool is identified within the rectum.  No free fluid or fluid collections within the abdomen or the pelvis.  Review of the visualized osseous structures shows diffuse osteopenia.  There is old fracture deformity involving the right superior and inferior pubic rami.  Age indeterminate compression fracture involves the L1 vertebra. There is loss of approximately 50% of the vertebral body height at this level.  IMPRESSION:  1.  Increased caliber of the common bile duct with intermediate density structure in the region of the distal CBD/pancreatic head. This may represent noncalcified  gallstone or pancreatic mass. Consider further evaluation with contrast enhanced MRCP. 2.  Osteopenia and L1 compression deformity.Per CMS PQRS reporting requirements (PQRS Measure 24): Given the patient's age of greater than 50 and the fracture site (hip, distal radius, or spine), the patient should be tested for osteoporosis using DXA, and the appropriate treatment considered based on the DXA  results.  Original Report Authenticated By: Rosealee Albee, M.D.   Dg Finger Thumb Right  10/30/2010  *RADIOLOGY REPORT*  Clinical Data: Fall with right thumb pain.  RIGHT THUMB 2+V  Comparison: None  Findings: A fracture of the proximal first metacarpal is noted with 4 mm medial displacement and mild apex medial angulation. There is no evidence of subluxation or dislocation. Diffuse osteopenia is present. Mild degenerative changes at the first MCP and interphalangeal joints noted.  IMPRESSION: Proximal first metacarpal fracture as described.  Original Report Authenticated By: Rosendo Gros, M.D.        Juliet Rude. Rubin Payor, MD 10/30/10 2050  Juliet Rude. Rubin Payor, MD 10/30/10 2050

## 2010-10-31 ENCOUNTER — Inpatient Hospital Stay (HOSPITAL_COMMUNITY): Payer: Medicare Other

## 2010-10-31 ENCOUNTER — Inpatient Hospital Stay (HOSPITAL_COMMUNITY)
Admission: AD | Admit: 2010-10-31 | Discharge: 2010-11-05 | DRG: 551 | Disposition: A | Discharge status: Skilled Nursing Facility | Payer: Medicare Other | Source: Other Acute Inpatient Hospital | Attending: Internal Medicine | Admitting: Internal Medicine

## 2010-10-31 DIAGNOSIS — Z7982 Long term (current) use of aspirin: Secondary | ICD-10-CM

## 2010-10-31 DIAGNOSIS — Z9181 History of falling: Secondary | ICD-10-CM

## 2010-10-31 DIAGNOSIS — K831 Obstruction of bile duct: Secondary | ICD-10-CM | POA: Diagnosis present

## 2010-10-31 DIAGNOSIS — I2789 Other specified pulmonary heart diseases: Secondary | ICD-10-CM | POA: Diagnosis present

## 2010-10-31 DIAGNOSIS — W19XXXA Unspecified fall, initial encounter: Secondary | ICD-10-CM | POA: Diagnosis present

## 2010-10-31 DIAGNOSIS — F329 Major depressive disorder, single episode, unspecified: Secondary | ICD-10-CM | POA: Diagnosis present

## 2010-10-31 DIAGNOSIS — I4891 Unspecified atrial fibrillation: Secondary | ICD-10-CM | POA: Diagnosis present

## 2010-10-31 DIAGNOSIS — F0390 Unspecified dementia without behavioral disturbance: Secondary | ICD-10-CM | POA: Diagnosis present

## 2010-10-31 DIAGNOSIS — S0003XA Contusion of scalp, initial encounter: Secondary | ICD-10-CM | POA: Diagnosis present

## 2010-10-31 DIAGNOSIS — Z8673 Personal history of transient ischemic attack (TIA), and cerebral infarction without residual deficits: Secondary | ICD-10-CM

## 2010-10-31 DIAGNOSIS — M810 Age-related osteoporosis without current pathological fracture: Secondary | ICD-10-CM | POA: Diagnosis present

## 2010-10-31 DIAGNOSIS — F3289 Other specified depressive episodes: Secondary | ICD-10-CM | POA: Diagnosis present

## 2010-10-31 DIAGNOSIS — I059 Rheumatic mitral valve disease, unspecified: Secondary | ICD-10-CM

## 2010-10-31 DIAGNOSIS — I2510 Atherosclerotic heart disease of native coronary artery without angina pectoris: Secondary | ICD-10-CM | POA: Diagnosis present

## 2010-10-31 DIAGNOSIS — I10 Essential (primary) hypertension: Secondary | ICD-10-CM | POA: Diagnosis present

## 2010-10-31 DIAGNOSIS — Z951 Presence of aortocoronary bypass graft: Secondary | ICD-10-CM

## 2010-10-31 DIAGNOSIS — D649 Anemia, unspecified: Secondary | ICD-10-CM | POA: Diagnosis present

## 2010-10-31 DIAGNOSIS — S62309A Unspecified fracture of unspecified metacarpal bone, initial encounter for closed fracture: Secondary | ICD-10-CM | POA: Diagnosis present

## 2010-10-31 DIAGNOSIS — E039 Hypothyroidism, unspecified: Secondary | ICD-10-CM | POA: Diagnosis present

## 2010-10-31 DIAGNOSIS — Z79899 Other long term (current) drug therapy: Secondary | ICD-10-CM

## 2010-10-31 DIAGNOSIS — E785 Hyperlipidemia, unspecified: Secondary | ICD-10-CM | POA: Diagnosis present

## 2010-10-31 DIAGNOSIS — K5900 Constipation, unspecified: Secondary | ICD-10-CM | POA: Diagnosis not present

## 2010-10-31 DIAGNOSIS — R0902 Hypoxemia: Secondary | ICD-10-CM | POA: Diagnosis not present

## 2010-10-31 DIAGNOSIS — S22009A Unspecified fracture of unspecified thoracic vertebra, initial encounter for closed fracture: Principal | ICD-10-CM | POA: Diagnosis present

## 2010-10-31 LAB — URINE CULTURE
Colony Count: 15000
Culture  Setup Time: 201207290230

## 2010-10-31 LAB — HEMOGLOBIN A1C: Hgb A1c MFr Bld: 6.2 % — ABNORMAL HIGH (ref <5.7)

## 2010-10-31 LAB — LIPID PANEL
LDL Cholesterol: 74 mg/dL (ref 0–99)
Triglycerides: 116 mg/dL (ref <150)
VLDL: 23 mg/dL (ref 0–40)

## 2010-10-31 LAB — CARDIAC PANEL(CRET KIN+CKTOT+MB+TROPI)
CK, MB: 2 ng/mL (ref 0.3–4.0)
Relative Index: INVALID (ref 0.0–2.5)
Total CK: 42 U/L (ref 7–177)
Troponin I: <0.3 ng/mL (ref <0.30)
Troponin I: <0.3 ng/mL (ref <0.30)

## 2010-11-01 ENCOUNTER — Inpatient Hospital Stay (HOSPITAL_COMMUNITY): Payer: Medicare Other

## 2010-11-01 LAB — URINE CULTURE

## 2010-11-02 LAB — COMPREHENSIVE METABOLIC PANEL
ALT: 11 U/L (ref 0–35)
AST: 16 U/L (ref 0–37)
CO2: 23 mEq/L (ref 19–32)
Chloride: 110 mEq/L (ref 96–112)
Creatinine, Ser: 0.85 mg/dL (ref 0.50–1.10)
GFR calc non Af Amer: >60 mL/min (ref >60)
Sodium: 141 mEq/L (ref 135–145)
Total Bilirubin: 0.3 mg/dL (ref 0.3–1.2)

## 2010-11-03 ENCOUNTER — Inpatient Hospital Stay (HOSPITAL_COMMUNITY): Payer: Medicare Other

## 2010-11-03 LAB — CARDIAC PANEL(CRET KIN+CKTOT+MB+TROPI)
CK, MB: 3.1 ng/mL (ref 0.3–4.0)
Relative Index: INVALID (ref 0.0–2.5)
Total CK: 81 U/L (ref 7–177)
Total CK: 80 U/L (ref 7–177)
Troponin I: <0.3 ng/mL (ref <0.30)

## 2010-11-03 MED ORDER — IOHEXOL 300 MG/ML  SOLN
80.0000 mL | Freq: Once | INTRAMUSCULAR | Status: AC | PRN
  Administered 2010-11-03: 80 mL via INTRAVENOUS

## 2010-11-04 ENCOUNTER — Inpatient Hospital Stay (HOSPITAL_COMMUNITY): Payer: Medicare Other

## 2010-11-04 LAB — CBC
HCT: 29.8 % — ABNORMAL LOW (ref 36.0–46.0)
MCHC: 32.9 g/dL (ref 30.0–36.0)
MCV: 90.9 fL (ref 78.0–100.0)
RDW: 14.1 % (ref 11.5–15.5)

## 2010-11-04 LAB — BASIC METABOLIC PANEL
BUN: 17 mg/dL (ref 6–23)
Chloride: 105 mEq/L (ref 96–112)
Creatinine, Ser: 0.83 mg/dL (ref 0.50–1.10)
GFR calc Af Amer: >60 mL/min (ref >60)
GFR calc non Af Amer: >60 mL/min (ref >60)

## 2010-11-23 NOTE — H&P (Signed)
[name], DEUTSCHMAN NO.:  [id number]  MEDICAL RECORD NO.:  [id number]  LOCATION:  3739                         FACILITY:  [facility]  PHYSICIAN:  Houston Siren, MD           DATE OF BIRTH:  [birth year]  DATE OF ADMISSION:  10/31/2010 DATE OF DISCHARGE:                             HISTORY & PHYSICAL   REASON FOR ADMISSION:  UTI, recurrent syncope, and new onset of atrial fibrillation with controlled ventricular rate.  ADVANCE DIRECTIVE:  Full code.  This was reconfirmed tonight.  PRIMARY CARE PHYSICIAN:  Burnell Blanks, MD in [place], [facility].  HISTORY OF PRESENT ILLNESS:  This is a 77 year old female with history of coronary artery disease status post prior coronary artery bypass graft, hypertension, hypothyroidism, history of TIAs, having recurrent falls which sounds like from vertigo, hypercholesterolemia, fell today and had pain in her right flank area, and thus presents to [facility].  She was found to be in atrial fibrillation with controlled rate.  Workup at [facility] also shows that she has a urinary tract infection.  An abdominopelvic CT was done and it did not show any intraperitoneal hemorrhage but the common duct was dilated.  There was evidence of a pancreatic mass or stone but it cannot be excluded.  With this information, I reviewed her past medical history and she generally does not have any abdominal cramps or pain, nausea, vomiting and prior to the fall, she really did not have any GI complaints.  Furthermore, she has normal liver function tests and no fever, chills, nausea, vomiting at this time.  She also complained of right thumb pain and x- ray showed that she has a fracture of the proximal first digit.  A wrist splint was placed, and subsequently, he was transferred here for further evaluation and treatment.  PAST MEDICAL HISTORY: 1. Osteoporosis. 2. Dyslipidemia. 3. Hypertension. 4. Coronary artery disease status  post bypass. 5. Dementia. 6. Depression. 7. Dyslipidemia. 8. Hypothyroidism. 9. History of TIA. 10.Recurrent falls.  SOCIAL HISTORY:  She is a retired Runner, broadcasting/film/video.  She recently became a widow. She does not use tobacco, alcohol, or drugs.  She stays at home with her daughter with outside assistance as well.  ALLERGIES:  SULFA.  FAMILY HISTORY:  Positive for dementia and stroke.  REVIEW OF SYSTEMS:  Otherwise unremarkable.  PHYSICAL EXAMINATION:  VITAL SIGNS:  Heart rate is about 93, blood pressure 130/80, respiratory rate of around 12, temperature 98.9. GENERAL:  She is alert and oriented and conversing. HEENT:  She has fluent speech with facial symmetry.  Tongue is midline. Uvula elevated with phonation.  Sclerae are nonicteric.  She does have a scalp contusion on the left side but it is not a battleground hematoma. NECK:  Supple. CARDIAC:  S1, S2 irregularly irregular with a soft 2/6 systolic ejection murmur at the left sternal border. LUNGS:  No wheezes, rales, or any evidence of consolidation. ABDOMEN:  Soft, nondistended, nontender.  Murphy sign is negative.  She has no peripheral stigmata of chronic liver disease.  No ascites.  Bowel sounds present.  No palpable mass.  I tried to feel her  right flank area and it is tender.  I am suspicious that she might have had a small contusion or a bruise after the fall rather than problem internally. EXTREMITIES:  With no edema.  Strength equal bilaterally. SKIN:  Warm and dry and has no peripheral signs of shock.  Good distal pulses bilaterally.  Psychiatric exam is unremarkable as well.  OBJECTIVE FINDINGS:  EKG:  Atrial fibrillation with ventricular rate around 110.  No acute ST-T changes.  White count of 6800, hemoglobin of 10.9, MCV of 92, potassium 4.4, blood glucose 130, creatinine 1.0. Liver function tests were all normal.  Albumin 3.4, potassium 4.4, INR of 0.94, troponin less than 0.3.  Urinalysis was cloudy with  small amount of leukocytes and many bacteria with 3-6 wbc's.  IMPRESSION:  This is a 77 year old female who is highly functional, lives at home with assistance except that she has recurrent falls, presents with new onset of atrial fibrillation with rate control.  She is definitely not a candidate for anticoagulation because of the recurrent falls, and thus I will increase her aspirin from 81 mg to 325 mg per day.  She does have a urinary tract infection.  We will treat it with Rocephin intravenously.  She has a fracture of her first metacarpal but she is not in any amount of pain and thus the wrist splint should be adequate.  She needs to have an echo along with TSH.  We will continue her thyroid supplements.  As soon as her medications are  reconciled, we will continue them as well.  She does have a contusion on her left scalp and given her age and her osteoporosis, she really needs to have a plain CT of her head.  My suspicion for intracranial hemorrhage or subdural hematoma is very low, but it can happen.  She is stable otherwise, and will be admitted to telemetry unit under [facility].  Again, she is a full code and this was reconfirmed tonight.     Houston Siren, MD     PL/MEDQ  D:  10/31/2010  T:  10/31/2010  Job:  [id number]  Electronically Signed by Houston Siren  on 11/23/2010 10:18:53 PM

## 2010-11-27 NOTE — Consult Note (Signed)
[name], [name] NO.:  [id number]  MEDICAL RECORD NO.:  [id number]  LOCATION:  MH01                          FACILITY:  [facility]  PHYSICIAN:  Levie Heritage, MD       DATE OF BIRTH:  1917/03/22  DATE OF CONSULTATION:  10/31/2010 DATE OF DISCHARGE:  10/31/2010                                CONSULTATION   CONSULTING PHYSICIAN:  Levie Heritage, MD  CHIEF COMPLAINT:  Possible CVA.  HISTORY OF PRESENT ILLNESS:  This is a 77 year old white female with a history of coronary artery disease, hypertension, hyperlipidemia, and hypothyroidism admitted early this morning secondary to a fall with hip pain, scalp hematoma, and vertigo.  She was noted to be in AFib on admission.  Head CT showed no hemorrhage, but did show subacute infarct in the right parietal region.  There was extensive atrophy.  Stroke workup has been initiated by the [facility].  Neurology consult has been called to assist with management.  MEDICATIONS: 1. Aspirin 81 mg a day. 2. Aggrenox.3. Vytorin. 4. Lisinopril. 5. Synthroid. 6. Seroquel. 7. Celexa. 8. Calcium.  ALLERGIES:  SULFA.  PAST MEDICAL HISTORY: 1. Significant for recurrent falls. 2. History of TIA. 3. History of CAD status post CABG. 4. Hypertension. 5. Hyperlipidemia. 6. Depression. 7. Dementia. 8. Hypothyroidism. 9. Diet-controlled diabetes.  SOCIAL HISTORY:  The patient is a retired Runner, broadcasting/film/video.  Recent widow.  Lives independently with a caregiver and family close by.  No alcohol, illicit drug use, or tobacco use.  REVIEW OF SYSTEMS:  Denies fevers, chills, nausea, vomiting, diarrhea, or constipation.  No abdominal pain.  No visual changes.  No headaches or syncopal episodes.  No chest pain or shortness of breath.  PHYSICAL EXAMINATION:  VITAL SIGNS:  Temperature 98.1, blood pressure 132/93, pulse 109, respirations 18, SaO2 96% on room air. NEUROLOGIC:  The patient is alert and oriented x3, slightly tearful. Speech is  fluent.  Has good insight.  No dysarthria.  Can follow multi- step commands.  Torticollis to the left.  PERRL.  EOMI.  Visual fields intact.  Gaze is conjugate.  Face appears slightly asymmetric, however, smile is symmetric.  Tongue is midline.  Shoulder shrug intact. Strength is 5/5 x4 extremities.  Grips are equal bilaterally.  Sensation is intact light touch diffusely.  DTRs are 2+ in the upper and lower extremities with downgoing toes bilaterally.  Finger-to-nose, heel-to- shin is intact.  White cell 6.8, hemoglobin 10.9, and platelets 188,000.  INR 0.9. Sodium 144, potassium 4.4, BUN 29 creatinine 1.0.  Glucose 130.  Total cholesterol 151, triglyceride 116, HDL 54, and LDL 74.  UA was positive for UTI.  Culture and sensitivity is pending.  CT of the head without contrast showed subacute infarct in the right parietal area with old right frontal cortical/subcortical infarction. She has advanced atrophy with extensive small vessel disease.  ASSESSMENT/PLAN:  This is a 77 year old white female with multiple falls over the last few years.  New onset of atrial fibrillation without focal neurologic deficit at present.  CT showed a low-density area which is likely extension of microvascular ischemic change or subacute stroke, (although less likely).  PLAN: 1. MRI, MRA  of the brain to rule out ischemic stroke. 2. Restart home medications. 3. Given multiple falls, she is not a good candidate for Coumadin     therapy.  Recommend OT and PT consult.  The patient has been seen and examined by Dr. Hoy Morn.     [name], P.A.  I have seen the patient and agree with above clinical findings. ______________________________ Levie Heritage, MD    TCJ/MEDQ  D:  10/31/2010  T:  10/31/2010  Job:  [id number]  cc:   Levie Heritage, MD  Electronically Signed by Delice Bison JERNEJCIC P.A. on 11/27/2010 07:43:03 AM Electronically Signed by Levie Heritage MD on 11/27/2010 08:17:48 AM

## 2010-12-15 NOTE — Discharge Summary (Signed)
NAMEJUSTYNE, ROELL NO.:  [id number]  MEDICAL RECORD NO.:  [id number]  LOCATION:  3739                         FACILITY:  [facility]  PHYSICIAN:  Kyerra Vargo I Ziaire Hagos, MD      DATE OF BIRTH:  19-Oct-1916  DATE OF ADMISSION:  10/31/2010 DATE OF DISCHARGE:  11/05/2010                              DISCHARGE SUMMARY   DISCHARGE DIAGNOSES: 1. Recurrent fall. 2. New onset atrial fibrillation with rapid ventricular response, rate     currently under control.  The patient not a good candidate for     anticoagulation secondary to multiple fall. 3. Anemia. 4. T12 compression fracture of the superior endplate. 5. Moderate-to-severe left atrial enlargement. 6. Mild-to-moderate pulmonary hypertension. 7. Common bile duct dilatation with no evidence of mass or stone with     normal LFTs.  Fiber workup if needed as outpatient. 8. Osteoporosis. 9. Multiple old fracture of the rib from falling. 10.Coronary artery disease status post bypass. 11.Dementia. 12.Depression. 13.Hypothyroidism. 14.History of transient ischemic attack. 15.History of recurrent fall. 16.Dyslipidemia.  CONSULTATION:  Neuro consulted.  PROCEDURES: 1. CT abdomen and pelvis, increased caliber of common bile duct with     intermediate density structure in the region of distal common bile     duct, pancreatic head, this may represent noncalcified gallstone or     pancreatic mass.  Consider further evaluation with contrast-     enhanced MRCP.  Osteopenia and L1 compression deformity. 2. Finger x-ray, proximal first metacarpal fracture. 3. Rib x-ray, multiple old right rib fracture, no acute displaced     right rib fracture, emphysema with cardiomegaly, no evidence of     pneumothorax or pleural effusion. 4. Hip x-ray, no acute abnormality. 5. CT head without contrast, extensive chronic small vessel disease     throughout both hemisphere.  Old right frontal cortical and     subcortical infarct.  Newly seen  infarction in the right parietal     region could be subacute. 6. MRCP, increased caliber of the common bile duct without evidence of     obstruction, stone, or mass, suspect this is a common bile duct     stricture. 7. MRI of the brain without contrast, intracranial atherosclerotic     type change. 8. MRI head and neck, no acute infarct. 9. CT angio, no evidence of PE, cardiomegaly with mild interstitial     edema, moderate in size, and a small to moderate left effusion. 10.Chest x-ray, bilateral pleural effusion, but consistent with CHF. 11.X-ray of the lumbar, no acute abnormality of the lumbar spine, old     fracture and L1 fracture. 12.Thoracic spine x-ray, possible new slight compression fracture of     the superior endplate of T12.  HISTORY OF PRESENT ILLNESS:  This is a 77 year old female, actually she will be 82 in the next 10 days, with history of coronary artery disease, status post prior coronary artery bypass graft, hypertension, hypothyroidism.  She has been experiencing a fall and had pain in her right flank area, and presented to [facility], the patient found to have AFib with controlled rate. 1. Atrial fibrillation, rate was controlled at date  of admission.  The     patient has multiple fall with multiple fracture that put the     patient at risk from anticoagulation.  Accordingly, the patient     will be discharged with aspirin and Aggrenox.  Family informed the     risks of anticoagulation and agreed.  During the hospital stay, the     patient developed another episode with AFib with RVR, was start     initiating beta-blocker.  Her heart rate around 82, is still with     persistent AFib with RVR.  The cardiac enzyme is negative.  2D echo     did not show any evidence of wall motion abnormalities.  EF was     found to be 55-60% and there is no evidence of wall motion     abnormality with intermittent diastolic dysfunction.  Also during     hospital stay,  the patient developed some shortness of breath with     some hypoxia.  Chest x-ray suggest pulmonary edema which could be     secondary to the IV fluid the patient received.  The patient will     be discharged with a small dose of Lasix 20 mg p.o. daily.  We     recommend a nursing home to monitor her renal function during     administration of the Lasix.  The patient has no further episode of     shortness of breath or chest pain.  Currently, the patient's rate     under control. 2. Secondary to the recurrent fall, workup to rule out stroke was done     which include CT head and MRI, which was negative.  Also     consultation by the Neurology, Dr. Hoy Morn. 3. Right lower back pain.  X-ray of the lumbar spine did show old     fracture, but the thoracic spine suggest T12 endplate fracture and     this could be managed by pain medication. 4. Common bile duct dilatation.  MRCP did not suggest any stone or     masses, but it is likely related to a stricture.  The patient's     LFTs are within normal and no further workup was done as the     patient is asymptomatic at the time being. 5. Fracture of the patient's proximal first metacarpal fracture.  The     patient will be continuing with her splint for a total of 4-6     weeks.  Currently, we felt the patient is stable for discharge.  DISCHARGE MEDICATIONS: 1. Furosemide 20 mg p.o. daily. 2. Metoprolol 50 mg p.o. b.i.d. 3. MiraLax 17 g p.o. daily. 4. Lisinopril 5 mg daily. 5. Aggrenox 25/200 q.12 hour. 6. Alprazolam 0.25 mg q.8 hour as needed. 7. Aspirin 81 mg daily. 8. Calcium carbonate/vitamin D one tablet daily. 9. Celexa 20 mg daily. 10.Docusate two capsule daily. 11.Hydrocodone/APAP 5/500 every 6 hour as needed. 12.Synthroid 50 mcg every other day. 13.Synthroid 75 mcg every other day. 14.Nitroglycerin 0.4 mg every 5 minutes as needed. 15.Omeprazole 20 mg p.o. daily. 16.Quetiapine 50 mg daily. 17.Vytorin 10/80 mg daily.  The  patient's code status is full code.  At this time, we felt the patient is stable for discharge.     Zaydan Papesh Bosie Helper, MD     HIE/MEDQ  D:  11/05/2010  T:  11/05/2010  Job:  [id number]  cc:   Burnell Blanks, MD  Electronically Signed by Ebony Cargo  MD on 12/15/2010 03:32:49 PM

## 2011-01-03 LAB — COMPREHENSIVE METABOLIC PANEL
AST: 34
AST: 38 — ABNORMAL HIGH
Albumin: 2.8 — ABNORMAL LOW
Albumin: 2.9 — ABNORMAL LOW
BUN: 31 — ABNORMAL HIGH
Calcium: 8.8
Calcium: 9
Chloride: 107
Creatinine, Ser: 1.12
Creatinine, Ser: 1.28 — ABNORMAL HIGH
GFR calc Af Amer: 48 — ABNORMAL LOW
GFR calc Af Amer: 55 — ABNORMAL LOW
Sodium: 137
Total Protein: 5.5 — ABNORMAL LOW
Total Protein: 5.9 — ABNORMAL LOW

## 2011-01-03 LAB — GLUCOSE, CAPILLARY
Glucose-Capillary: 110 — ABNORMAL HIGH
Glucose-Capillary: 137 — ABNORMAL HIGH
Glucose-Capillary: 154 — ABNORMAL HIGH
Glucose-Capillary: 89

## 2011-01-03 LAB — URINE CULTURE: Colony Count: >=100000

## 2011-01-03 LAB — URINALYSIS, ROUTINE W REFLEX MICROSCOPIC
Bilirubin Urine: NEGATIVE
Glucose, UA: NEGATIVE
Hgb urine dipstick: NEGATIVE
Ketones, ur: NEGATIVE
Protein, ur: NEGATIVE
Urobilinogen, UA: 0.2

## 2011-01-03 LAB — POCT I-STAT, CHEM 8
BUN: 42 — ABNORMAL HIGH
Calcium, Ion: 1.13
Chloride: 110
HCT: 29 — ABNORMAL LOW
HCT: 30 — ABNORMAL LOW
Hemoglobin: 10.2 — ABNORMAL LOW
Hemoglobin: 9.9 — ABNORMAL LOW
Sodium: 138
TCO2: 22

## 2011-01-03 LAB — CBC
HCT: 26.4 — ABNORMAL LOW
HCT: 29.1 — ABNORMAL LOW
HCT: 29.1 — ABNORMAL LOW
HCT: 29.5 — ABNORMAL LOW
HCT: 26.9 — ABNORMAL LOW
Hemoglobin: 9.9 — ABNORMAL LOW
Hemoglobin: 9 — ABNORMAL LOW
MCHC: 33.7
MCHC: 34.1
MCHC: 33.8
MCV: 90.5
MCV: 91.4
MCV: 92.1
Platelets: 142 — ABNORMAL LOW
Platelets: 158
Platelets: 172
Platelets: 192
RBC: 2.97 — ABNORMAL LOW
RDW: 12.9
RDW: 13
RDW: 13
RDW: 13.9
RDW: 13.8
WBC: 9.3
WBC: 6.3

## 2011-01-03 LAB — POCT CARDIAC MARKERS
CKMB, poc: 1.2
Myoglobin, poc: 232

## 2011-01-03 LAB — CARDIAC PANEL(CRET KIN+CKTOT+MB+TROPI)
Relative Index: 1.1
Total CK: 192 — ABNORMAL HIGH
Troponin I: 0.02
Troponin I: 0.04

## 2011-01-03 LAB — METHYLMALONIC ACID(MMA), RND URINE
Creatinine, Urine mg/dL-MMAURN: 12 mg/dL
Methylmalonic Acid, Ur: 2.7 umol/L

## 2011-01-03 LAB — LIPID PANEL
HDL: 35 — ABNORMAL LOW
Total CHOL/HDL Ratio: 3.9
Triglycerides: 136
VLDL: 27

## 2011-01-03 LAB — BASIC METABOLIC PANEL
BUN: 21
CO2: 28
Calcium: 9.3
Creatinine, Ser: 1.11
Creatinine, Ser: 1.18
GFR calc Af Amer: 52 — ABNORMAL LOW
GFR calc non Af Amer: 46 — ABNORMAL LOW
Glucose, Bld: 139 — ABNORMAL HIGH
Glucose, Bld: 104 — ABNORMAL HIGH
Potassium: 4.3

## 2011-01-03 LAB — BLOOD GAS, ARTERIAL
Acid-base deficit: 2.4 — ABNORMAL HIGH
Bicarbonate: 20.8
Drawn by: 21338
TCO2: 21.7
pCO2 arterial: 29.7 — ABNORMAL LOW
pO2, Arterial: 60.1 — ABNORMAL LOW

## 2011-01-03 LAB — IRON AND TIBC
Saturation Ratios: 10 — ABNORMAL LOW
TIBC: 301
UIBC: 272

## 2011-01-03 LAB — HEMOGLOBIN A1C
Hgb A1c MFr Bld: 5.6
Hgb A1c MFr Bld: 5.6
Mean Plasma Glucose: 114

## 2011-01-03 LAB — FOLATE: Folate: 19

## 2011-01-03 LAB — PROTIME-INR
INR: 1
Prothrombin Time: 13.5

## 2011-01-03 LAB — FERRITIN: Ferritin: 391 — ABNORMAL HIGH (ref 10–291)

## 2011-01-03 LAB — TROPONIN I: Troponin I: 0.03

## 2011-01-03 LAB — CK TOTAL AND CKMB (NOT AT ARMC)
CK, MB: 2
Total CK: 185 — ABNORMAL HIGH

## 2011-04-19 DIAGNOSIS — E039 Hypothyroidism, unspecified: Secondary | ICD-10-CM | POA: Diagnosis not present

## 2011-08-23 DIAGNOSIS — I10 Essential (primary) hypertension: Secondary | ICD-10-CM | POA: Diagnosis not present

## 2011-08-23 DIAGNOSIS — E7800 Pure hypercholesterolemia, unspecified: Secondary | ICD-10-CM | POA: Diagnosis not present

## 2011-08-23 DIAGNOSIS — E039 Hypothyroidism, unspecified: Secondary | ICD-10-CM | POA: Diagnosis not present

## 2012-01-03 DIAGNOSIS — Z23 Encounter for immunization: Secondary | ICD-10-CM | POA: Diagnosis not present

## 2012-02-14 DIAGNOSIS — M954 Acquired deformity of chest and rib: Secondary | ICD-10-CM | POA: Diagnosis not present

## 2012-02-14 DIAGNOSIS — Z951 Presence of aortocoronary bypass graft: Secondary | ICD-10-CM | POA: Diagnosis not present

## 2012-02-14 DIAGNOSIS — M25519 Pain in unspecified shoulder: Secondary | ICD-10-CM | POA: Diagnosis not present

## 2012-02-14 DIAGNOSIS — M959 Acquired deformity of musculoskeletal system, unspecified: Secondary | ICD-10-CM | POA: Diagnosis not present

## 2012-02-14 DIAGNOSIS — M949 Disorder of cartilage, unspecified: Secondary | ICD-10-CM | POA: Diagnosis not present

## 2012-02-14 DIAGNOSIS — M899 Disorder of bone, unspecified: Secondary | ICD-10-CM | POA: Diagnosis not present

## 2012-02-14 DIAGNOSIS — Z4789 Encounter for other orthopedic aftercare: Secondary | ICD-10-CM | POA: Diagnosis not present

## 2012-02-14 DIAGNOSIS — J90 Pleural effusion, not elsewhere classified: Secondary | ICD-10-CM | POA: Diagnosis not present

## 2012-02-14 DIAGNOSIS — R079 Chest pain, unspecified: Secondary | ICD-10-CM | POA: Diagnosis not present

## 2012-03-07 DIAGNOSIS — E7800 Pure hypercholesterolemia, unspecified: Secondary | ICD-10-CM | POA: Diagnosis not present

## 2012-03-07 DIAGNOSIS — I10 Essential (primary) hypertension: Secondary | ICD-10-CM | POA: Diagnosis not present

## 2012-03-07 DIAGNOSIS — E039 Hypothyroidism, unspecified: Secondary | ICD-10-CM | POA: Diagnosis not present

## 2014-07-29 DIAGNOSIS — E039 Hypothyroidism, unspecified: Secondary | ICD-10-CM | POA: Diagnosis not present

## 2014-07-29 DIAGNOSIS — Z79899 Other long term (current) drug therapy: Secondary | ICD-10-CM | POA: Diagnosis not present

## 2014-07-29 DIAGNOSIS — I10 Essential (primary) hypertension: Secondary | ICD-10-CM | POA: Diagnosis not present

## 2014-07-29 DIAGNOSIS — F0390 Unspecified dementia without behavioral disturbance: Secondary | ICD-10-CM | POA: Diagnosis not present

## 2014-07-29 DIAGNOSIS — Z9181 History of falling: Secondary | ICD-10-CM | POA: Diagnosis not present

## 2014-07-29 DIAGNOSIS — E780 Pure hypercholesterolemia: Secondary | ICD-10-CM | POA: Diagnosis not present

## 2014-07-29 DIAGNOSIS — N183 Chronic kidney disease, stage 3 (moderate): Secondary | ICD-10-CM | POA: Diagnosis not present

## 2014-07-29 DIAGNOSIS — F419 Anxiety disorder, unspecified: Secondary | ICD-10-CM | POA: Diagnosis not present

## 2014-07-29 DIAGNOSIS — Z1389 Encounter for screening for other disorder: Secondary | ICD-10-CM | POA: Diagnosis not present

## 2015-01-27 DIAGNOSIS — N183 Chronic kidney disease, stage 3 (moderate): Secondary | ICD-10-CM | POA: Diagnosis not present

## 2015-01-27 DIAGNOSIS — Z9181 History of falling: Secondary | ICD-10-CM | POA: Diagnosis not present

## 2015-01-27 DIAGNOSIS — Z23 Encounter for immunization: Secondary | ICD-10-CM | POA: Diagnosis not present

## 2015-01-27 DIAGNOSIS — Z1389 Encounter for screening for other disorder: Secondary | ICD-10-CM | POA: Diagnosis not present

## 2015-01-27 DIAGNOSIS — E7800 Pure hypercholesterolemia, unspecified: Secondary | ICD-10-CM | POA: Diagnosis not present

## 2015-01-27 DIAGNOSIS — I10 Essential (primary) hypertension: Secondary | ICD-10-CM | POA: Diagnosis not present

## 2015-01-27 DIAGNOSIS — E039 Hypothyroidism, unspecified: Secondary | ICD-10-CM | POA: Diagnosis not present

## 2015-01-27 DIAGNOSIS — R636 Underweight: Secondary | ICD-10-CM | POA: Diagnosis not present

## 2015-05-05 DIAGNOSIS — E039 Hypothyroidism, unspecified: Secondary | ICD-10-CM | POA: Diagnosis not present

## 2015-08-06 DIAGNOSIS — I10 Essential (primary) hypertension: Secondary | ICD-10-CM | POA: Diagnosis not present

## 2015-08-06 DIAGNOSIS — F0390 Unspecified dementia without behavioral disturbance: Secondary | ICD-10-CM | POA: Diagnosis not present

## 2015-08-06 DIAGNOSIS — N183 Chronic kidney disease, stage 3 (moderate): Secondary | ICD-10-CM | POA: Diagnosis not present

## 2015-08-06 DIAGNOSIS — I4891 Unspecified atrial fibrillation: Secondary | ICD-10-CM | POA: Diagnosis not present

## 2015-08-06 DIAGNOSIS — R636 Underweight: Secondary | ICD-10-CM | POA: Diagnosis not present

## 2015-08-06 DIAGNOSIS — F419 Anxiety disorder, unspecified: Secondary | ICD-10-CM | POA: Diagnosis not present

## 2015-08-06 DIAGNOSIS — E7800 Pure hypercholesterolemia, unspecified: Secondary | ICD-10-CM | POA: Diagnosis not present

## 2015-08-06 DIAGNOSIS — E039 Hypothyroidism, unspecified: Secondary | ICD-10-CM | POA: Diagnosis not present

## 2015-10-08 DIAGNOSIS — J069 Acute upper respiratory infection, unspecified: Secondary | ICD-10-CM | POA: Diagnosis not present

## 2015-10-08 DIAGNOSIS — I2510 Atherosclerotic heart disease of native coronary artery without angina pectoris: Secondary | ICD-10-CM | POA: Diagnosis not present

## 2015-10-08 DIAGNOSIS — Z681 Body mass index (BMI) 19 or less, adult: Secondary | ICD-10-CM | POA: Diagnosis not present

## 2016-01-06 ENCOUNTER — Emergency Department (HOSPITAL_BASED_OUTPATIENT_CLINIC_OR_DEPARTMENT_OTHER): Payer: Medicare Other

## 2016-01-06 ENCOUNTER — Emergency Department (HOSPITAL_BASED_OUTPATIENT_CLINIC_OR_DEPARTMENT_OTHER)
Admission: EM | Admit: 2016-01-06 | Discharge: 2016-01-06 | Disposition: A | Discharge status: Home / Self Care | Payer: Medicare Other | Attending: Emergency Medicine | Admitting: Emergency Medicine

## 2016-01-06 ENCOUNTER — Encounter (HOSPITAL_BASED_OUTPATIENT_CLINIC_OR_DEPARTMENT_OTHER): Payer: Self-pay | Admitting: *Deleted

## 2016-01-06 DIAGNOSIS — W19XXXA Unspecified fall, initial encounter: Secondary | ICD-10-CM

## 2016-01-06 DIAGNOSIS — M79631 Pain in right forearm: Secondary | ICD-10-CM | POA: Diagnosis not present

## 2016-01-06 DIAGNOSIS — I10 Essential (primary) hypertension: Secondary | ICD-10-CM | POA: Diagnosis not present

## 2016-01-06 DIAGNOSIS — S59911A Unspecified injury of right forearm, initial encounter: Secondary | ICD-10-CM | POA: Diagnosis present

## 2016-01-06 DIAGNOSIS — Z79899 Other long term (current) drug therapy: Secondary | ICD-10-CM | POA: Insufficient documentation

## 2016-01-06 DIAGNOSIS — S0990XA Unspecified injury of head, initial encounter: Secondary | ICD-10-CM | POA: Insufficient documentation

## 2016-01-06 DIAGNOSIS — W1839XA Other fall on same level, initial encounter: Secondary | ICD-10-CM | POA: Insufficient documentation

## 2016-01-06 DIAGNOSIS — Z7982 Long term (current) use of aspirin: Secondary | ICD-10-CM | POA: Insufficient documentation

## 2016-01-06 DIAGNOSIS — Y929 Unspecified place or not applicable: Secondary | ICD-10-CM | POA: Diagnosis not present

## 2016-01-06 DIAGNOSIS — S51811A Laceration without foreign body of right forearm, initial encounter: Secondary | ICD-10-CM | POA: Diagnosis not present

## 2016-01-06 DIAGNOSIS — Y939 Activity, unspecified: Secondary | ICD-10-CM | POA: Insufficient documentation

## 2016-01-06 DIAGNOSIS — Y999 Unspecified external cause status: Secondary | ICD-10-CM | POA: Insufficient documentation

## 2016-01-06 NOTE — ED Notes (Signed)
MD at bedside. 

## 2016-01-06 NOTE — Discharge Instructions (Signed)
Return to the ED with any concerns including vomiting, seizure activity, increased pain or arm, redness around wound or pus draining, decreased level of alertness/lethargy, or any other alarming symptoms

## 2016-01-06 NOTE — ED Provider Notes (Signed)
[facility]EMERGENCY DEPT Provider Note   CSN: [id number] Arrival date & time: 01/06/16  1318     History   Chief Complaint Chief Complaint  Patient presents with  . Fall    HPI [name] is a 82 y.o. female.  HPI  Pt presenting after mehanicall fall.  She was with her daughter using her walker and lost balance falling onto her right side.  Daughter states she hit the right side of her head, no LOC, no vomiting, no seizure activity.  Injury occurred approx 1 hour prior to arrival.  She has a skin tear to her right forearm.  Bleeding controlled with pressure.  Pt denies any neck or back pain.  No pain in legs or hips.  She does not take blood thinners.  There are no other associated systemic symptoms, there are no other alleviating or modifying factors.   Past Medical History:  Diagnosis Date  . Hypercholesteremia   . Hypertension     There are no active problems to display for this patient.   Past Surgical History:  Procedure Laterality Date  . APPENDECTOMY    . CORONARY ARTERY BYPASS GRAFT      OB History    No data available       Home Medications    Prior to Admission medications   Medication Sig Start Date End Date Taking? Authorizing Provider  aspirin 81 MG tablet Take 81 mg by mouth daily.      Historical Provider, MD  Calcium Carbonate-Vitamin D (CALCIUM 600+D) 600-400 MG-UNIT per tablet Take 1 tablet by mouth 2 (two) times daily.      Historical Provider, MD  citalopram (CELEXA) 20 MG tablet Take 20 mg by mouth daily.      Historical Provider, MD  dipyridamole-aspirin (AGGRENOX) 25-200 MG per 12 hr capsule Take 1 capsule by mouth 2 (two) times daily.      Historical Provider, MD  DOCUSATE SODIUM PO Take 1 capsule by mouth daily.      Historical Provider, MD  ezetimibe-simvastatin (VYTORIN) 10-40 MG per tablet Take 1 tablet by mouth at bedtime.      Historical Provider, MD  HYDROcodone-acetaminophen (VICODIN) 5-500 MG per tablet Take 1 tablet by mouth 2 (two)  times daily as needed. For arthritis pain      Historical Provider, MD  levothyroxine (SYNTHROID, LEVOTHROID) 50 MCG tablet Take 50 mcg by mouth daily.      Historical Provider, MD  levothyroxine (SYNTHROID, LEVOTHROID) 75 MCG tablet Take 75 mcg by mouth daily.      Historical Provider, MD  lisinopril (PRINIVIL,ZESTRIL) 10 MG tablet Take 10 mg by mouth 2 (two) times daily.      Historical Provider, MD  omeprazole (PRILOSEC) 20 MG capsule Take 20 mg by mouth daily.      Historical Provider, MD  QUEtiapine (SEROQUEL) 50 MG tablet Take 50 mg by mouth 2 (two) times daily.      Historical Provider, MD    Family History No family history on file.  Social History Social History  Substance Use Topics  . Smoking status: Never Smoker  . Smokeless tobacco: Not on file  . Alcohol use No     Allergies   Sulfa antibiotics   Review of Systems Review of Systems  ROS reviewed and all otherwise negative except for mentioned in HPI   Physical Exam Updated Vital Signs BP 145/68 (BP Location: Left Arm)   Pulse 103   Temp 97.9 F (36.6 C)   Resp  20   Ht 4\' 11"  (1.499 m)   Wt 45.4 kg   SpO2 100%   BMI 20.20 kg/m  Vitals reviewed Physical Exam Physical Examination: General appearance - alert, well/frail appearing, and in no distress Mental status - alert, oriented to person, place, and time Eyes - pupils equal and reactive, extraocular eye movements intact Neck - no midine tenderness to palpation Head- NCAT Chest - clear to auscultation, no wheezes, rales or rhonchi, symmetric air entry Heart - normal rate, regular rhythm, normal S1, S2, no murmurs, rubs, clicks or gallops Back exam - no midline tenderness to palpation Neurological - alert, oriented, normal speech, GCS 15, moving all extremities Musculoskeletal - no joint tenderness, deformity or swelling, ttp over right forearm Extremities - peripheral pulses normal, no pedal edema, no clubbing or cyanosis Skin - normal coloration and  turgor, no rashes- 2 areas of skin tear on right forearm, bleeding controlled  ED Treatments / Results  Labs (all labs ordered are listed, but only abnormal results are displayed) Labs Reviewed - No data to display  EKG  EKG Interpretation None       Radiology Dg Forearm Right  Result Date: 01/06/2016 CLINICAL DATA:  Right forearm pain after fall today. EXAM: RIGHT FOREARM - 2 VIEW COMPARISON:  Radiographs of December 30, 2007. FINDINGS: There is no evidence of fracture or other focal bone lesions. Vascular calcifications are noted. Probable hematoma is seen along the dorsal aspect of the proximal right forearm. IMPRESSION: No fracture or dislocation is noted. Probable soft tissue hematoma seen posteriorly in proximal right forearm. Electronically Signed   By: Lupita Raider, M.D.   On: 01/06/2016 14:31   Ct Head Wo Contrast  Result Date: 01/06/2016 CLINICAL DATA:  Recent fall EXAM: CT HEAD WITHOUT CONTRAST TECHNIQUE: Contiguous axial images were obtained from the base of the skull through the vertex without intravenous contrast. COMPARISON:  11/01/2010 FINDINGS: Brain: Diffuse atrophic changes are noted. Areas of prior infarct are seen in the right frontal lobe near the vertex and right posterior parietal lobe and occipital lobe. Scattered cerebellar infarcts are noted as well. No findings to suggest acute hemorrhage, acute infarction or space-occupying mass lesion are seen. Vascular: No hyperdense vessel or unexpected calcification. Skull: Normal. Negative for fracture or focal lesion. Sinuses/Orbits: No acute finding. Other: None. IMPRESSION: Chronic atrophic and ischemic changes with areas of prior infarction stable from previous exams. No acute abnormality noted. Electronically Signed   By: Alcide Clever M.D.   On: 01/06/2016 14:40    Procedures Procedures (including critical care time)  Medications Ordered in ED Medications - No data to display   Initial Impression / Assessment  and Plan / ED Course  I have reviewed the triage vital signs and the nursing notes.  Pertinent labs & imaging results that were available during my care of the patient were reviewed by me and considered in my medical decision making (see chart for details).  Clinical Course    Pt presenting after mechanical fall with c/o pain in left forearm and skin tear.  Did strike head- but no LOC- head CT reassuring.  Skin tear not amenable to sutures- wound was dressed with steri strips, kerlex bandaging- d/w daughters how to re-dress wound during healing process- will add antibiotic ointment, wet dressing to remove if sticking, etc.  Discussed signs of infection as well that would warrant recheck.  Discharged with strict return precautions.  Pt agreeable with plan.  Final Clinical Impressions(s) / ED Diagnoses  Final diagnoses:  Fall, initial encounter  Minor head injury, initial encounter  Skin tear of right forearm without complication, initial encounter    New Prescriptions Discharge Medication List as of 01/06/2016  2:53 PM       Jerelyn ScottMartha Linker, MD 01/07/16 [phone]

## 2016-01-06 NOTE — ED Triage Notes (Signed)
Caregiver states pt fell x 1 hr ago , c/o skin tear to right upper arm

## 2016-01-08 DIAGNOSIS — Z9181 History of falling: Secondary | ICD-10-CM | POA: Diagnosis not present

## 2016-01-08 DIAGNOSIS — S0090XS Unspecified superficial injury of unspecified part of head, sequela: Secondary | ICD-10-CM | POA: Diagnosis not present

## 2016-01-08 DIAGNOSIS — I4891 Unspecified atrial fibrillation: Secondary | ICD-10-CM | POA: Diagnosis not present

## 2016-01-08 DIAGNOSIS — S51811S Laceration without foreign body of right forearm, sequela: Secondary | ICD-10-CM | POA: Diagnosis not present

## 2016-03-01 DIAGNOSIS — E7800 Pure hypercholesterolemia, unspecified: Secondary | ICD-10-CM | POA: Diagnosis not present

## 2016-03-01 DIAGNOSIS — Z681 Body mass index (BMI) 19 or less, adult: Secondary | ICD-10-CM | POA: Diagnosis not present

## 2016-03-01 DIAGNOSIS — E039 Hypothyroidism, unspecified: Secondary | ICD-10-CM | POA: Diagnosis not present

## 2016-03-01 DIAGNOSIS — I10 Essential (primary) hypertension: Secondary | ICD-10-CM | POA: Diagnosis not present

## 2016-03-01 DIAGNOSIS — Z9181 History of falling: Secondary | ICD-10-CM | POA: Diagnosis not present

## 2016-03-01 DIAGNOSIS — R636 Underweight: Secondary | ICD-10-CM | POA: Diagnosis not present

## 2016-03-01 DIAGNOSIS — F419 Anxiety disorder, unspecified: Secondary | ICD-10-CM | POA: Diagnosis not present

## 2016-03-01 DIAGNOSIS — Z1389 Encounter for screening for other disorder: Secondary | ICD-10-CM | POA: Diagnosis not present

## 2016-05-06 ENCOUNTER — Emergency Department (HOSPITAL_COMMUNITY): Payer: Medicare Other

## 2016-05-06 ENCOUNTER — Inpatient Hospital Stay (HOSPITAL_COMMUNITY)
Admission: EM | Admit: 2016-05-06 | Discharge: 2016-06-02 | DRG: 193 | Disposition: E | Payer: Medicare Other | Attending: Internal Medicine | Admitting: Internal Medicine

## 2016-05-06 DIAGNOSIS — I2510 Atherosclerotic heart disease of native coronary artery without angina pectoris: Secondary | ICD-10-CM | POA: Diagnosis present

## 2016-05-06 DIAGNOSIS — Z882 Allergy status to sulfonamides status: Secondary | ICD-10-CM

## 2016-05-06 DIAGNOSIS — I9589 Other hypotension: Secondary | ICD-10-CM | POA: Diagnosis present

## 2016-05-06 DIAGNOSIS — E7800 Pure hypercholesterolemia, unspecified: Secondary | ICD-10-CM | POA: Diagnosis present

## 2016-05-06 DIAGNOSIS — I10 Essential (primary) hypertension: Secondary | ICD-10-CM | POA: Diagnosis present

## 2016-05-06 DIAGNOSIS — J9601 Acute respiratory failure with hypoxia: Secondary | ICD-10-CM | POA: Diagnosis present

## 2016-05-06 DIAGNOSIS — Z681 Body mass index (BMI) 19 or less, adult: Secondary | ICD-10-CM

## 2016-05-06 DIAGNOSIS — I4891 Unspecified atrial fibrillation: Secondary | ICD-10-CM

## 2016-05-06 DIAGNOSIS — R05 Cough: Secondary | ICD-10-CM

## 2016-05-06 DIAGNOSIS — R64 Cachexia: Secondary | ICD-10-CM | POA: Diagnosis present

## 2016-05-06 DIAGNOSIS — J1000 Influenza due to other identified influenza virus with unspecified type of pneumonia: Secondary | ICD-10-CM | POA: Diagnosis not present

## 2016-05-06 DIAGNOSIS — J09X1 Influenza due to identified novel influenza A virus with pneumonia: Secondary | ICD-10-CM | POA: Diagnosis present

## 2016-05-06 DIAGNOSIS — E785 Hyperlipidemia, unspecified: Secondary | ICD-10-CM | POA: Diagnosis present

## 2016-05-06 DIAGNOSIS — Z515 Encounter for palliative care: Secondary | ICD-10-CM | POA: Diagnosis not present

## 2016-05-06 DIAGNOSIS — J1100 Influenza due to unidentified influenza virus with unspecified type of pneumonia: Secondary | ICD-10-CM

## 2016-05-06 DIAGNOSIS — Z66 Do not resuscitate: Secondary | ICD-10-CM | POA: Diagnosis present

## 2016-05-06 DIAGNOSIS — Z91048 Other nonmedicinal substance allergy status: Secondary | ICD-10-CM

## 2016-05-06 DIAGNOSIS — R059 Cough, unspecified: Secondary | ICD-10-CM

## 2016-05-06 DIAGNOSIS — R0602 Shortness of breath: Secondary | ICD-10-CM

## 2016-05-06 DIAGNOSIS — J09X2 Influenza due to identified novel influenza A virus with other respiratory manifestations: Secondary | ICD-10-CM

## 2016-05-06 DIAGNOSIS — Z7982 Long term (current) use of aspirin: Secondary | ICD-10-CM

## 2016-05-06 DIAGNOSIS — Z8673 Personal history of transient ischemic attack (TIA), and cerebral infarction without residual deficits: Secondary | ICD-10-CM

## 2016-05-06 DIAGNOSIS — F0390 Unspecified dementia without behavioral disturbance: Secondary | ICD-10-CM

## 2016-05-06 DIAGNOSIS — E039 Hypothyroidism, unspecified: Secondary | ICD-10-CM | POA: Diagnosis present

## 2016-05-06 DIAGNOSIS — E861 Hypovolemia: Secondary | ICD-10-CM | POA: Diagnosis present

## 2016-05-06 DIAGNOSIS — Z951 Presence of aortocoronary bypass graft: Secondary | ICD-10-CM

## 2016-05-06 DIAGNOSIS — Z7189 Other specified counseling: Secondary | ICD-10-CM

## 2016-05-06 LAB — COMPREHENSIVE METABOLIC PANEL
ALT: 34 U/L (ref 14–54)
AST: 49 U/L — ABNORMAL HIGH (ref 15–41)
Albumin: 3.3 g/dL — ABNORMAL LOW (ref 3.5–5.0)
Alkaline Phosphatase: 70 U/L (ref 38–126)
Anion gap: 11 (ref 5–15)
BUN: 27 mg/dL — ABNORMAL HIGH (ref 6–20)
CO2: 24 mmol/L (ref 22–32)
Calcium: 9.5 mg/dL (ref 8.9–10.3)
Chloride: 99 mmol/L — ABNORMAL LOW (ref 101–111)
Creatinine, Ser: 0.8 mg/dL (ref 0.44–1.00)
GFR calc Af Amer: >60 mL/min (ref >60)
GFR calc non Af Amer: 60 mL/min — ABNORMAL LOW (ref >60)
Glucose, Bld: 262 mg/dL — ABNORMAL HIGH (ref 65–99)
Potassium: 4.7 mmol/L (ref 3.5–5.1)
Sodium: 134 mmol/L — ABNORMAL LOW (ref 135–145)
Total Bilirubin: 0.6 mg/dL (ref 0.3–1.2)
Total Protein: 7.1 g/dL (ref 6.5–8.1)

## 2016-05-06 LAB — CBC WITH DIFFERENTIAL/PLATELET
Basophils Absolute: 0 10*3/uL (ref 0.0–0.1)
Basophils Relative: 0 %
Eosinophils Absolute: 0 10*3/uL (ref 0.0–0.7)
Eosinophils Relative: 0 %
HCT: 37.7 % (ref 36.0–46.0)
Hemoglobin: 12.4 g/dL (ref 12.0–15.0)
Lymphocytes Relative: 5 %
Lymphs Abs: 0.5 10*3/uL — ABNORMAL LOW (ref 0.7–4.0)
MCH: 29.5 pg (ref 26.0–34.0)
MCHC: 32.9 g/dL (ref 30.0–36.0)
MCV: 89.8 fL (ref 78.0–100.0)
Monocytes Absolute: 0.7 10*3/uL (ref 0.1–1.0)
Monocytes Relative: 7 %
Neutro Abs: 8.7 10*3/uL — ABNORMAL HIGH (ref 1.7–7.7)
Neutrophils Relative %: 88 %
Platelets: 163 10*3/uL (ref 150–400)
RBC: 4.2 MIL/uL (ref 3.87–5.11)
RDW: 15.7 % — ABNORMAL HIGH (ref 11.5–15.5)
WBC: 9.9 10*3/uL (ref 4.0–10.5)

## 2016-05-06 LAB — URINALYSIS, ROUTINE W REFLEX MICROSCOPIC
Bacteria, UA: NONE SEEN
Bilirubin Urine: NEGATIVE
Glucose, UA: 150 mg/dL — AB
Hgb urine dipstick: NEGATIVE
Ketones, ur: NEGATIVE mg/dL
Nitrite: NEGATIVE
Protein, ur: 100 mg/dL — AB
Specific Gravity, Urine: 1.026 (ref 1.005–1.030)
Squamous Epithelial / LPF: NONE SEEN
pH: 5 (ref 5.0–8.0)

## 2016-05-06 LAB — INFLUENZA PANEL BY PCR (TYPE A & B)
INFLAPCR: POSITIVE — AB
Influenza B By PCR: NEGATIVE

## 2016-05-06 LAB — I-STAT CG4 LACTIC ACID, ED
Lactic Acid, Venous: 2.24 mmol/L (ref 0.5–1.9)
Lactic Acid, Venous: 3.32 mmol/L (ref 0.5–1.9)

## 2016-05-06 MED ORDER — MORPHINE SULFATE (PF) 4 MG/ML IV SOLN
1.0000 mg | INTRAVENOUS | Status: DC | PRN
  Administered 2016-05-06 – 2016-05-09 (×7): 1 mg via INTRAVENOUS
  Filled 2016-05-06 (×7): qty 1

## 2016-05-06 MED ORDER — AZITHROMYCIN 250 MG PO TABS: 250.0000 mg | ORAL_TABLET | Freq: Every day | ORAL | Status: DC

## 2016-05-06 MED ORDER — DEXTROSE 5 % IV SOLN
500.0000 mg | Freq: Once | INTRAVENOUS | Status: AC
  Administered 2016-05-06: 500 mg via INTRAVENOUS
  Filled 2016-05-06 (×2): qty 500

## 2016-05-06 MED ORDER — SODIUM CHLORIDE 0.9% FLUSH
3.0000 mL | Freq: Two times a day (BID) | INTRAVENOUS | Status: DC
  Administered 2016-05-08 – 2016-05-11 (×8): 3 mL via INTRAVENOUS

## 2016-05-06 MED ORDER — SODIUM CHLORIDE 0.9 % IV BOLUS (SEPSIS)
1000.0000 mL | Freq: Once | INTRAVENOUS | Status: AC
  Administered 2016-05-06: 1000 mL via INTRAVENOUS

## 2016-05-06 MED ORDER — QUETIAPINE FUMARATE 50 MG PO TABS
50.0000 mg | ORAL_TABLET | Freq: Every day | ORAL | Status: DC
  Administered 2016-05-07 – 2016-05-09 (×4): 50 mg via ORAL
  Filled 2016-05-06 (×2): qty 1
  Filled 2016-05-06 (×2): qty 2
  Filled 2016-05-06: qty 1
  Filled 2016-05-06: qty 2
  Filled 2016-05-06 (×2): qty 1
  Filled 2016-05-06: qty 2
  Filled 2016-05-06: qty 1

## 2016-05-06 MED ORDER — ACETAMINOPHEN 325 MG PO TABS: 325.0000 mg | ORAL_TABLET | Freq: Four times a day (QID) | ORAL | Status: DC | PRN

## 2016-05-06 MED ORDER — AZITHROMYCIN 250 MG PO TABS
250.0000 mg | ORAL_TABLET | Freq: Every day | ORAL | Status: DC
  Administered 2016-05-07 – 2016-05-08 (×2): 250 mg via ORAL
  Filled 2016-05-06 (×2): qty 1

## 2016-05-06 MED ORDER — SODIUM CHLORIDE 0.9% FLUSH: 3.0000 mL | INTRAVENOUS | Status: DC | PRN

## 2016-05-06 MED ORDER — ENOXAPARIN SODIUM 30 MG/0.3ML ~~LOC~~ SOLN
30.0000 mg | SUBCUTANEOUS | Status: DC
  Administered 2016-05-07 – 2016-05-08 (×3): 30 mg via SUBCUTANEOUS
  Filled 2016-05-06 (×4): qty 0.3

## 2016-05-06 MED ORDER — SODIUM CHLORIDE 0.9% FLUSH
3.0000 mL | Freq: Two times a day (BID) | INTRAVENOUS | Status: DC
  Administered 2016-05-07 – 2016-05-11 (×9): 3 mL via INTRAVENOUS

## 2016-05-06 MED ORDER — ALBUTEROL SULFATE (2.5 MG/3ML) 0.083% IN NEBU
2.5000 mg | INHALATION_SOLUTION | RESPIRATORY_TRACT | Status: DC | PRN
  Administered 2016-05-07: 2.5 mg via RESPIRATORY_TRACT
  Filled 2016-05-06: qty 3

## 2016-05-06 MED ORDER — LEVOTHYROXINE SODIUM 50 MCG PO TABS
50.0000 ug | ORAL_TABLET | Freq: Every day | ORAL | Status: DC
  Administered 2016-05-07 – 2016-05-09 (×3): 50 ug via ORAL
  Filled 2016-05-06 (×3): qty 1

## 2016-05-06 MED ORDER — OSELTAMIVIR PHOSPHATE 75 MG PO CAPS
75.0000 mg | ORAL_CAPSULE | Freq: Every day | ORAL | Status: DC
  Administered 2016-05-07: 75 mg via ORAL
  Filled 2016-05-06: qty 1

## 2016-05-06 MED ORDER — SODIUM CHLORIDE 0.9 % IV SOLN: 250.0000 mL | INTRAVENOUS | Status: DC | PRN

## 2016-05-06 MED ORDER — DEXTROSE 5 % IV SOLN: 1.0000 g | INTRAVENOUS | Status: DC

## 2016-05-06 MED ORDER — CITALOPRAM HYDROBROMIDE 20 MG PO TABS: 20.0000 mg | ORAL_TABLET | Freq: Every day | ORAL | Status: DC

## 2016-05-06 MED ORDER — GUAIFENESIN ER 600 MG PO TB12
600.0000 mg | ORAL_TABLET | Freq: Two times a day (BID) | ORAL | Status: DC
  Administered 2016-05-07 (×2): 600 mg via ORAL
  Filled 2016-05-06 (×3): qty 1

## 2016-05-06 MED ORDER — ATORVASTATIN CALCIUM 40 MG PO TABS
40.0000 mg | ORAL_TABLET | Freq: Every day | ORAL | Status: DC
  Administered 2016-05-07 – 2016-05-08 (×3): 40 mg via ORAL
  Filled 2016-05-06 (×3): qty 1

## 2016-05-06 MED ORDER — CITALOPRAM HYDROBROMIDE 20 MG PO TABS
20.0000 mg | ORAL_TABLET | Freq: Every day | ORAL | Status: DC
  Administered 2016-05-07 – 2016-05-09 (×3): 20 mg via ORAL
  Filled 2016-05-06 (×4): qty 1

## 2016-05-06 MED ORDER — DEXTROSE 5 % IV SOLN
1.0000 g | Freq: Once | INTRAVENOUS | Status: AC
  Administered 2016-05-06: 1 g via INTRAVENOUS
  Filled 2016-05-06: qty 10

## 2016-05-06 MED ORDER — DEXTROSE 5 % IV SOLN
1.0000 g | INTRAVENOUS | Status: DC
  Administered 2016-05-07 – 2016-05-08 (×2): 1 g via INTRAVENOUS
  Filled 2016-05-06 (×3): qty 10

## 2016-05-06 MED ORDER — OSELTAMIVIR PHOSPHATE 75 MG PO CAPS
75.0000 mg | ORAL_CAPSULE | Freq: Once | ORAL | Status: AC
  Administered 2016-05-06: 75 mg via ORAL
  Filled 2016-05-06: qty 1

## 2016-05-06 MED ORDER — ASPIRIN EC 81 MG PO TBEC
81.0000 mg | DELAYED_RELEASE_TABLET | Freq: Every day | ORAL | Status: DC
  Administered 2016-05-07 – 2016-05-09 (×3): 81 mg via ORAL
  Filled 2016-05-06 (×3): qty 1

## 2016-05-06 NOTE — H&P (Signed)
Date: 05/13/2016               Patient Name:  Tanya AmenDaisy [name] MRN: [id number]  DOB: 1916/09/13 Age / Sex: [age] y.o., female   PCP: Pcp Not In System         Medical Service: Internal Medicine Teaching Service         Attending Physician: Dr. Azalia BilisKevin Campos, MD    First Contact: Dr. Alita ChyleJason Melehani  Pager: [phone]  Second Contact: Dr. Valentino NoseNathan Boswell Pager: [phone]       After Hours (After 5p/  First Contact Pager: [phone]  weekends / holidays): Second Contact Pager: [phone]   Chief Complaint: Cough   History of Present Illness: Patient is a 83 yo F with pmhx of HTN, HLD, CAD (s/p CABG), dementia, and CVA who presents today with respiratory distress. Patient has dementia and lives at home with family. History was provided by her daughter. Patient was in her usual state of health until Monday when she developed a cough. Family called EMS on Wednesday who came to evaluate her but did not feel she needed to come in to the ED. Cough continued to progress and today she developed some confusion worse than her baseline. She has also had some sinus congestion and rhinorrhea. Daughter says at baseline she is oriented to family members but today did not know who she was. Daughter denies fevers. No nausea, vomiting, or diarrhea. She denies chest pain and heart palpitations. Patient has a poor appetite at baseline but daughter feels it is worse than normal. She denies dysuria, she has chronic incontience and wears diapers normally. Possible sick contacts include a home caregiver who comes multiple times a week.   In the ED, patient was afebrile T 98.6, tachycardic to 135, with RR 24, BP 107/87, and hypoxic to the 80s on room air, improved to 98% on 2L [place]. Patient does not wear oxygen at home. EKG showed atrial fibrillation. Labs were significant for a lactic acid of 2.24. BMP and CBC were unremarkable (wbc of 9.9, creatinine 0.80).  Influenza PCR was positive for flu A. CXR revealed bilateral pulmonary  infiltrates.   Meds:  Current Meds  Medication Sig  . acetaminophen (TYLENOL) 325 MG tablet Take 325 mg by mouth every 6 (six) hours as needed (for pain).  Marland Kitchen. aspirin 81 MG tablet Take 81 mg by mouth daily.    Marland Kitchen. atorvastatin (LIPITOR) 80 MG tablet Take 40 mg by mouth at bedtime.   . citalopram (CELEXA) 20 MG tablet Take 20 mg by mouth daily.    Marland Kitchen. levothyroxine (SYNTHROID, LEVOTHROID) 50 MCG tablet Take 50 mcg by mouth daily.    Marland Kitchen. lisinopril (PRINIVIL,ZESTRIL) 5 MG tablet Take 5 mg by mouth daily.  . magnesium hydroxide (MILK OF MAGNESIA) 400 MG/5ML suspension Take 5 mLs by mouth daily as needed for mild constipation.  . Multiple Vitamins-Minerals (CENTRUM SILVER 50+WOMEN) TABS Take 1 tablet by mouth daily with breakfast.  . Phenylephrine-DM-GG-APAP (MUCINEX FAST-MAX SEVERE COLD) 5-10-200-325 MG/10ML LIQD Take 5 mLs by mouth 2 (two) times daily as needed (for congestion/phlegm).   . QUEtiapine (SEROQUEL) 50 MG tablet Take 50 mg by mouth at bedtime.      Allergies: Allergies as of 12/04/2016 - Review Complete 12/04/2016  Allergen Reaction Noted  . Tape Other (See Comments) 12/04/2016  . Sulfa antibiotics Rash 10/30/2010   Past Medical History:  Diagnosis Date  . Hypercholesteremia   . Hypertension     Family History: No relevant  family history. Denies malignancy, heart disease, lung disease.   Social History: Denies alcohol, tobacco, and illicit drug use. Lives with her 2 daughters. Entirely dependent for ADLs. Patient is able to brush her teeth and walk with a walker at baseline.   Review of Systems: A complete ROS was negative except as per HPI.   Physical Exam: Blood pressure 119/72, pulse 110, temperature 98.6 F (37 C), temperature source Rectal, resp. rate 23, height 5\' 5"  (1.651 m), weight 43.1 kg (95 lb), SpO2 100 %. Constitutional: Thin elderly lady in NAD, appears comfortable HEENT: PERRL, anicteric sclera.  Neck: Supple, trachea midline.  Cardiovascular: Tachycardic  and irregular, no murmurs, rubs, or gallops.  Pulmonary/Chest: Course bronchial breath sounds bilaterally in all lung fields, rhonchi   Abdominal: Soft, non tender, non distended. +BS.  Extremities: Warm and well perfused. Distal pulses intact. No edema.  Neurological: Alert, not oriented to self, family, or place. Answer simple questions. CN II - XII grossly intact.  Skin: No rashes or erythema   EKG: Personally reviewed. Atrial fibrillation, ventricular rate of 99. Multiple PVCs.   CXR: Personally reviewed. Bilateral patchy infiltrates.   Assessment & Plan by Problem:   Patient is a 83 yo F with pmhx of HTN, HLD, CAD (s/p CABG), dementia, and CVA who presents today with acute hypoxic respiratory failure secondary to influenza A PNA.   Acute Hypoxic Respiratory Failure: Secondary to influenza A infection. CXR with pulmonary infiltrates. ? Superimposed CAP.  Hemodynamically stable and sating well on [place].  -- Tamiflu, droplet precautions  -- Continue empiric ceftriaxone and azithromycin  -- Supplemental oxygen prn to maintain oxygen saturations  -- Blood cultures pending  -- Flutter valve  -- Repeat AM BMP  -- Encourage PO intake, SLP eval    Hypothyroidism: -- Continue home synthroid   Atrial Fibrillation: Rates currently well controlled  -- Tele monitoring   CAD: S/p CABG -- Continue ASA 81 daily   HTN: -- Hold lisinopril given soft BP   HLD: -- Continue lipitor   Dementia: -- Continue Seroquel 50 mg BID   FEN: No fluids, replete lytes prn, Regular diet  VTE ppx: Lovenox  Code Status: DNR/DNI    Dispo: Admit patient to Observation with expected length of stay less than 2 midnights.  Signed: Reymundo Poll, MD 29-May-2016, 6:04 PM  Pager: [phone]

## 2016-05-06 NOTE — ED Notes (Signed)
Called provider relay vitals, and request bp cuff be taken off.  Approved. Also informed provider she was npo, he will change medications.  Also requested pain medication for pt.

## 2016-05-06 NOTE — ED Triage Notes (Signed)
Pt brought by EMS from group home for respiratory difficulty.  Member has congested cough and is 89% on RA.  Pt placed on 2L O2.

## 2016-05-06 NOTE — ED Notes (Signed)
Paged IM for nurse

## 2016-05-06 NOTE — ED Triage Notes (Signed)
 10mg  Cardizem given by EMS for initial Afib RVR with a rate of 125

## 2016-05-06 NOTE — ED Notes (Signed)
Provider notified of BP, bedside 

## 2016-05-06 NOTE — ED Provider Notes (Signed)
[facility]PT Provider Note   CSN: [id number] Arrival date & time: May 20, 2016  1349     History   Chief Complaint Chief Complaint  Patient presents with  . Respiratory Distress    HPI [name] is a 83 y.o. female.  HPI   83 year old female presents today with upper respiratory infection. Daughter is at bedside and reports Monday patient experience upper respiratory congestion and sore throat. They note that patient started coughing approximately 2 days ago with shortness of breath and increasing weakness. Patient is normally able to ablate her own without difficulty, but is too weak to ambulate at this time. Patient receives around-the-clock care. Patient has some very minor dementia, more difficulty with short-term memory, and has been slightly off her baseline. Patient has not had any vomiting or diarrhea, no fevers at home. Patient does not wear oxygen at home. Patient was hypoxic on room air into the 80s today.    Past Medical History:  Diagnosis Date  . Hypercholesteremia   . Hypertension     Patient Active Problem List   Diagnosis Date Noted  . Influenza A May 20, 2016    Past Surgical History:  Procedure Laterality Date  . APPENDECTOMY    . CORONARY ARTERY BYPASS GRAFT      OB History    No data available       Home Medications    Prior to Admission medications   Medication Sig Start Date End Date Taking? Authorizing Provider  acetaminophen (TYLENOL) 325 MG tablet Take 325 mg by mouth every 6 (six) hours as needed (for pain).   Yes Historical Provider, MD  aspirin 81 MG tablet Take 81 mg by mouth daily.     Yes Historical Provider, MD  atorvastatin (LIPITOR) 80 MG tablet Take 40 mg by mouth at bedtime.    Yes Historical Provider, MD  citalopram (CELEXA) 20 MG tablet Take 20 mg by mouth daily.     Yes Historical Provider, MD  levothyroxine (SYNTHROID, LEVOTHROID) 50 MCG tablet Take 50 mcg by mouth daily.     Yes Historical Provider, MD  lisinopril  (PRINIVIL,ZESTRIL) 5 MG tablet Take 5 mg by mouth daily.   Yes Historical Provider, MD  magnesium hydroxide (MILK OF MAGNESIA) 400 MG/5ML suspension Take 5 mLs by mouth daily as needed for mild constipation.   Yes Historical Provider, MD  Multiple Vitamins-Minerals (CENTRUM SILVER 50+WOMEN) TABS Take 1 tablet by mouth daily with breakfast.   Yes Historical Provider, MD  Phenylephrine-DM-GG-APAP (MUCINEX FAST-MAX SEVERE COLD) 5-10-200-325 MG/10ML LIQD Take 5 mLs by mouth 2 (two) times daily as needed (for congestion/phlegm).    Yes Historical Provider, MD  QUEtiapine (SEROQUEL) 50 MG tablet Take 50 mg by mouth at bedtime.    Yes Historical Provider, MD    Family History No family history on file.  Social History Social History  Substance Use Topics  . Smoking status: Never Smoker  . Smokeless tobacco: Not on file  . Alcohol use No     Allergies   Tape and Sulfa antibiotics   Review of Systems Review of Systems  All other systems reviewed and are negative.    Physical Exam Updated Vital Signs BP 107/87   Pulse (!) 135   Temp 98.6 F (37 C) (Rectal)   Resp 24   Ht 5\' 5"  (1.651 m)   Wt 43.1 kg   SpO2 98%   BMI 15.81 kg/m   Physical Exam  Constitutional: She is oriented to person, place, and time. She  appears well-developed and well-nourished.  HENT:  Head: Normocephalic and atraumatic.  Eyes: Conjunctivae are normal. Pupils are equal, round, and reactive to light. Right eye exhibits no discharge. Left eye exhibits no discharge. No scleral icterus.  Neck: Normal range of motion. No JVD present. No tracheal deviation present.  Pulmonary/Chest: Effort normal. No stridor. She has no wheezes.  Rales bilateral  Neurological: She is alert and oriented to person, place, and time. Coordination normal.  Skin: Skin is warm.  Psychiatric: She has a normal mood and affect. Her behavior is normal. Judgment and thought content normal.  Nursing note and vitals reviewed.    ED  Treatments / Results  Labs (all labs ordered are listed, but only abnormal results are displayed) Labs Reviewed  COMPREHENSIVE METABOLIC PANEL - Abnormal; Notable for the following:       Result Value   Sodium 134 (*)    Chloride 99 (*)    Glucose, Bld 262 (*)    BUN 27 (*)    Albumin 3.3 (*)    AST 49 (*)    GFR calc non Af Amer 60 (*)    All other components within normal limits  CBC WITH DIFFERENTIAL/PLATELET - Abnormal; Notable for the following:    RDW 15.7 (*)    Neutro Abs 8.7 (*)    Lymphs Abs 0.5 (*)    All other components within normal limits  URINALYSIS, ROUTINE W REFLEX MICROSCOPIC - Abnormal; Notable for the following:    Color, Urine AMBER (*)    Glucose, UA 150 (*)    Protein, ur 100 (*)    Leukocytes, UA TRACE (*)    All other components within normal limits  INFLUENZA PANEL BY PCR (TYPE A & B) - Abnormal; Notable for the following:    Influenza A By PCR POSITIVE (*)    All other components within normal limits  I-STAT CG4 LACTIC ACID, ED - Abnormal; Notable for the following:    Lactic Acid, Venous 2.24 (*)    All other components within normal limits  CULTURE, BLOOD (ROUTINE X 2)  CULTURE, BLOOD (ROUTINE X 2)  URINE CULTURE  I-STAT CG4 LACTIC ACID, ED    EKG  EKG Interpretation None       Radiology Dg Chest Port 1 View  Result Date: 05/11/2016 CLINICAL DATA:  Pt brought by EMS from group home for respiratory difficulty. Member has congested cough and is 89% on RA. Pt placed on 2L O2.cough EXAM: PORTABLE CHEST 1 VIEW COMPARISON:  02/14/2012 FINDINGS: Status post median sternotomy and CABG. The heart is enlarged. Patient is rotated towards the left. There are increased opacities throughout the lungs bilaterally. Suspect left pleural effusion. Remote left clavicle fracture. IMPRESSION: 1. Cardiomegaly. 2. Increased bilateral pulmonary infiltrates. Electronically Signed   By: Norva PavlovElizabeth  Brown M.D.   On: 05/27/2016 15:52    Procedures Procedures  (including critical care time)  Medications Ordered in ED Medications  azithromycin (ZITHROMAX) 500 mg in dextrose 5 % 250 mL IVPB (500 mg Intravenous New Bag/Given 05/28/2016 1824)  sodium chloride 0.9 % bolus 1,000 mL (0 mLs Intravenous Stopped 05/19/2016 1700)  cefTRIAXone (ROCEPHIN) 1 g in dextrose 5 % 50 mL IVPB (0 g Intravenous Stopped 05/10/2016 1715)  oseltamivir (TAMIFLU) capsule 75 mg (75 mg Oral Given 05/07/2016 1716)     Initial Impression / Assessment and Plan / ED Course  I have reviewed the triage vital signs and the nursing notes.  Pertinent labs & imaging results that were available  during my care of the patient were reviewed by me and considered in my medical decision making (see chart for details).     Final Clinical Impressions(s) / ED Diagnoses   Final diagnoses:  Cough   Labs:  CBC, CMP, lactic acid, influenza, blood cultures- influenza A positive , lactic acid 2.24  Imaging: DG portable- bilateral pulmonary infiltrates  Consults:  Therapeutics:Tamiflu, Rocephin, Zithromax, normal saline  Discharge Meds:    Assessment/Plan:   83 year old female presents today with sepsis secondary to influenza. Patient was initially started on the sepsis protocol with broad-spectrum antibiotics and fluid. Patient's influenza coming back positive with initiation of Tamiflu. Patient currently on 2 L with normal oxygen saturation and no signs of distress. She has audible Rales. Hospitalist service will be consult for admission.  New Prescriptions New Prescriptions   No medications on file     Eyvonne Mechanic, PA-C 2016-06-03 1840    Azalia Bilis, MD 05/10/16 [phone]

## 2016-05-06 NOTE — ED Notes (Signed)
Spoke to floor and then bed control, floor refused to take pt.  Bedcontrol working on situation.  Pt placed back in the room and family notified.

## 2016-05-07 ENCOUNTER — Inpatient Hospital Stay (HOSPITAL_COMMUNITY): Payer: Medicare Other

## 2016-05-07 ENCOUNTER — Encounter (HOSPITAL_COMMUNITY): Payer: Self-pay | Admitting: *Deleted

## 2016-05-07 DIAGNOSIS — I4891 Unspecified atrial fibrillation: Secondary | ICD-10-CM | POA: Diagnosis present

## 2016-05-07 DIAGNOSIS — J810 Acute pulmonary edema: Secondary | ICD-10-CM

## 2016-05-07 DIAGNOSIS — J1000 Influenza due to other identified influenza virus with unspecified type of pneumonia: Secondary | ICD-10-CM | POA: Diagnosis present

## 2016-05-07 DIAGNOSIS — Z515 Encounter for palliative care: Secondary | ICD-10-CM | POA: Diagnosis not present

## 2016-05-07 DIAGNOSIS — Z66 Do not resuscitate: Secondary | ICD-10-CM | POA: Diagnosis present

## 2016-05-07 DIAGNOSIS — R05 Cough: Secondary | ICD-10-CM | POA: Diagnosis present

## 2016-05-07 DIAGNOSIS — J09X1 Influenza due to identified novel influenza A virus with pneumonia: Secondary | ICD-10-CM | POA: Diagnosis not present

## 2016-05-07 DIAGNOSIS — E7800 Pure hypercholesterolemia, unspecified: Secondary | ICD-10-CM | POA: Diagnosis present

## 2016-05-07 DIAGNOSIS — E785 Hyperlipidemia, unspecified: Secondary | ICD-10-CM | POA: Diagnosis present

## 2016-05-07 DIAGNOSIS — I10 Essential (primary) hypertension: Secondary | ICD-10-CM | POA: Diagnosis present

## 2016-05-07 DIAGNOSIS — I959 Hypotension, unspecified: Secondary | ICD-10-CM

## 2016-05-07 DIAGNOSIS — Z7189 Other specified counseling: Secondary | ICD-10-CM | POA: Diagnosis not present

## 2016-05-07 DIAGNOSIS — I9589 Other hypotension: Secondary | ICD-10-CM | POA: Diagnosis present

## 2016-05-07 DIAGNOSIS — J1100 Influenza due to unidentified influenza virus with unspecified type of pneumonia: Secondary | ICD-10-CM | POA: Diagnosis not present

## 2016-05-07 DIAGNOSIS — Z8673 Personal history of transient ischemic attack (TIA), and cerebral infarction without residual deficits: Secondary | ICD-10-CM | POA: Diagnosis not present

## 2016-05-07 DIAGNOSIS — Z91048 Other nonmedicinal substance allergy status: Secondary | ICD-10-CM | POA: Diagnosis not present

## 2016-05-07 DIAGNOSIS — R64 Cachexia: Secondary | ICD-10-CM | POA: Diagnosis present

## 2016-05-07 DIAGNOSIS — Z9981 Dependence on supplemental oxygen: Secondary | ICD-10-CM | POA: Diagnosis not present

## 2016-05-07 DIAGNOSIS — Z951 Presence of aortocoronary bypass graft: Secondary | ICD-10-CM | POA: Diagnosis not present

## 2016-05-07 DIAGNOSIS — I2510 Atherosclerotic heart disease of native coronary artery without angina pectoris: Secondary | ICD-10-CM | POA: Diagnosis present

## 2016-05-07 DIAGNOSIS — Z681 Body mass index (BMI) 19 or less, adult: Secondary | ICD-10-CM | POA: Diagnosis not present

## 2016-05-07 DIAGNOSIS — Z7982 Long term (current) use of aspirin: Secondary | ICD-10-CM | POA: Diagnosis not present

## 2016-05-07 DIAGNOSIS — F0390 Unspecified dementia without behavioral disturbance: Secondary | ICD-10-CM | POA: Diagnosis not present

## 2016-05-07 DIAGNOSIS — J9601 Acute respiratory failure with hypoxia: Secondary | ICD-10-CM | POA: Diagnosis present

## 2016-05-07 DIAGNOSIS — E039 Hypothyroidism, unspecified: Secondary | ICD-10-CM | POA: Diagnosis present

## 2016-05-07 DIAGNOSIS — Z882 Allergy status to sulfonamides status: Secondary | ICD-10-CM | POA: Diagnosis not present

## 2016-05-07 DIAGNOSIS — E861 Hypovolemia: Secondary | ICD-10-CM | POA: Diagnosis present

## 2016-05-07 DIAGNOSIS — Z79899 Other long term (current) drug therapy: Secondary | ICD-10-CM | POA: Diagnosis not present

## 2016-05-07 LAB — BLOOD CULTURE ID PANEL (REFLEXED)

## 2016-05-07 LAB — BASIC METABOLIC PANEL
Anion gap: 8 (ref 5–15)
BUN: 27 mg/dL — ABNORMAL HIGH (ref 6–20)
CHLORIDE: 109 mmol/L (ref 101–111)
CO2: 23 mmol/L (ref 22–32)
Calcium: 8.5 mg/dL — ABNORMAL LOW (ref 8.9–10.3)
Creatinine, Ser: 0.68 mg/dL (ref 0.44–1.00)
GFR calc non Af Amer: >60 mL/min (ref >60)
Glucose, Bld: 129 mg/dL — ABNORMAL HIGH (ref 65–99)
POTASSIUM: 5.1 mmol/L (ref 3.5–5.1)
SODIUM: 140 mmol/L (ref 135–145)

## 2016-05-07 LAB — URINE CULTURE: Culture: NO GROWTH

## 2016-05-07 MED ORDER — OSELTAMIVIR PHOSPHATE 30 MG PO CAPS
30.0000 mg | ORAL_CAPSULE | Freq: Every day | ORAL | Status: DC
  Administered 2016-05-08 – 2016-05-09 (×2): 30 mg via ORAL
  Filled 2016-05-07 (×2): qty 1

## 2016-05-07 MED ORDER — FUROSEMIDE 10 MG/ML IJ SOLN
10.0000 mg | Freq: Once | INTRAMUSCULAR | Status: AC
  Administered 2016-05-07: 10 mg via INTRAVENOUS
  Filled 2016-05-07: qty 2

## 2016-05-07 MED ORDER — GUAIFENESIN 100 MG/5ML PO SOLN
400.0000 mg | Freq: Three times a day (TID) | ORAL | Status: DC
  Administered 2016-05-08 – 2016-05-10 (×5): 400 mg via ORAL
  Filled 2016-05-07: qty 15
  Filled 2016-05-07: qty 5
  Filled 2016-05-07: qty 20
  Filled 2016-05-07 (×2): qty 5
  Filled 2016-05-07: qty 20
  Filled 2016-05-07: qty 15
  Filled 2016-05-07: qty 5

## 2016-05-07 NOTE — Progress Notes (Signed)
Internal Medicine Attending  Date: 05/07/2016  Patient name: Tanya AmenDaisy [name] Medical record number: [id number] Date of birth: Jan 10, 1917 Age: 83 y.o. Gender: female  I saw and evaluated the patient. I reviewed the resident's note by Dr. Karma GreaserBoswell and I agree with the resident's findings and plans as documented in his progress note.  [name] was resting comfortably when seen on rounds this morning. Her color had returned and she looked much less restless. She had a restful night, but is still not at her baseline from a mental status standpoint or from an oral intake standpoint. She had non-methicillin resistant staph species in both the aerobic and anaerobic bottles drawn from the left IV site. Identification is pending. The anaerobic and aerobic bottles from the right arm are no growth to date. She apparently has no hardware within her body that would raise the suspicion of even 1 positive blood culture bottle set. The ceftriaxone covering her community-acquired pneumonia should be adequate for the staph species pending speciation and further incubation of the right arm blood culture set. At this point this positive blood culture set from the left IV site may represent a contaminant. We are continuing the Tamiflu as well for her influenza A infection and her goals of care remain DO NOT RESUSCITATE with comfort measures only.

## 2016-05-07 NOTE — Progress Notes (Signed)
Nutrition Brief Note  Chart reviewed. Pt is comfort measures only. Per MD note, pt's daughters request pt to remain comfortable and to avoid invasive prolongation measures. No nutrition interventions warranted at this time.  Please consult as needed.   Roslyn SmilingStephanie Chianti Goh, MS, RD, LDN Pager # [phone] After hours/ weekend pager # [phone]

## 2016-05-07 NOTE — Progress Notes (Signed)
Subjective: [name] is doing well this morning. Had one episode of agitation and pain overnight and got a dose of morphine and was resting comfortably the rest of the night. She denies any pain today. Has no complaints. Daughter concerned about her PO intake this morning as she is not interested in eating currently.   Objective: Vital signs in last 24 hours: Vitals:   06-04-16 2047 06-04-16 2211 05/07/16 0119 05/07/16 0653  BP:  (!) 130/96 106/80 105/75  Pulse: (!) 49 68 (!) 104 (!) 102  Resp: (!) 29 20 20 19   Temp:  97.4 F (36.3 C)  97.8 F (36.6 C)  TempSrc:  Axillary  Axillary  SpO2: 97% 92% 100% 99%  Weight:  74 lb 4.8 oz (33.7 kg)  77 lb 14.4 oz (35.3 kg)  Height:  5' (1.524 m)     Weight change:   Intake/Output Summary (Last 24 hours) at 05/07/16 0947 Last data filed at 05/07/16 0700  Gross per 24 hour  Intake              368 ml  Output                0 ml  Net              368 ml    Physical Exam  Constitutional:  Thin, frail, cachetic elderly female resting comfortably in bed in no acute distress  Cardiovascular: Normal rate and normal heart sounds.   Irregular rhythm  Pulmonary/Chest: Effort normal. No respiratory distress. She has no wheezes.  Course breath sounds in left lower lung fields. Improved from previous.   Skin: Skin is warm and dry.   Medications: I have reviewed the patient's current medications. Scheduled Meds: . aspirin EC  81 mg Oral Daily  . atorvastatin  40 mg Oral QHS  . azithromycin  250 mg Oral q1800  . cefTRIAXone (ROCEPHIN)  IV  1 g Intravenous Q24H  . citalopram  20 mg Oral Daily  . enoxaparin (LOVENOX) injection  30 mg Subcutaneous Q24H  . guaiFENesin  600 mg Oral BID  . levothyroxine  50 mcg Oral QAC breakfast  . oseltamivir  75 mg Oral Daily  . QUEtiapine  50 mg Oral QHS  . sodium chloride flush  3 mL Intravenous Q12H  . sodium chloride flush  3 mL Intravenous Q12H   Continuous Infusions: PRN Meds:.sodium chloride,  acetaminophen, albuterol, morphine injection, sodium chloride flush Assessment/Plan:  CAP secondary to Influenza A: Improving today. She was satting well on 2L [place] overnight and weaned down to 0.5 L O2 today.   1) Influenza A: She was started on Tamiflu and we will continue supportive measures. No events overnight. Blood cultures have returned with staph species (non staph aureus) in 1 of 2 blood cultures. This likely represents a contaminant. Patient has no hardware (artifical valves, knee replacement, etc) that would put her at higher risk for infection. Currently on Ceftriaxone which should cover for any potential staph species. -Will continue Ceftriaxone, Azithro and Tamiflu today -Wean O2 to maintain sats >92% -Supportive care  -Discussion with the daughters informs us that the goals of care to keep her comfortable and therefore she would not be a candidate for BiPAP therapy or intubation.  Atrial Fibrillation: Heart rates in the low 100s and staying below 110. Will try to avoid any additional nodal blocking agents unless HR is consistently >110.   Hypotension likely secondary to volume depletion:  Encouraging PO intake. BP  is improved today with SBP in the 100s. Our MAP goal is > 55. Will bolus 500 mL as needed. We will also avoid further fluid bowl I if her O2 sats ration drops to 90% or lower.  Dementia:  Seroquel 50 mg bid  Dispo: Disposition is deferred at this time, awaiting improvement of current medical problems.  Anticipated discharge in approximately 1-2 day(s).   The patient does have a current PCP (Pcp Not In System) and does need an [facility] hospital follow-up appointment after discharge.  The patient does not have transportation limitations that hinder transportation to clinic appointments.  .Services Needed at time of discharge: Y = Yes, Blank = No PT:   OT:   RN:   Equipment:   Other:     LOS: 1 day   Valentino Nose, MD IMTS PGY-1 [phone] 05/07/2016, 9:47 AM

## 2016-05-07 NOTE — Progress Notes (Signed)
Patient oxygen weaned to 0.5L.  Current O2 sat 96%. Will continue to monitor. A.Jalil Lorusso, RN

## 2016-05-07 NOTE — Progress Notes (Signed)
  PHARMACY - PHYSICIAN COMMUNICATION CRITICAL VALUE ALERT - BLOOD CULTURE IDENTIFICATION (BCID)  Results for orders placed or performed during the hospital encounter of Jan 18, 2017  Blood Culture ID Panel (Reflexed) (Collected: 05/27/2016  3:37 PM)  Result Value Ref Range   Enterococcus species NOT DETECTED NOT DETECTED   Listeria monocytogenes NOT DETECTED NOT DETECTED   Staphylococcus species DETECTED (A) NOT DETECTED   Staphylococcus aureus NOT DETECTED NOT DETECTED   Methicillin resistance NOT DETECTED NOT DETECTED   Streptococcus species NOT DETECTED NOT DETECTED   Streptococcus agalactiae NOT DETECTED NOT DETECTED   Streptococcus pneumoniae NOT DETECTED NOT DETECTED   Streptococcus pyogenes NOT DETECTED NOT DETECTED   Acinetobacter baumannii NOT DETECTED NOT DETECTED   Enterobacteriaceae species NOT DETECTED NOT DETECTED   Enterobacter cloacae complex NOT DETECTED NOT DETECTED   Escherichia coli NOT DETECTED NOT DETECTED   Klebsiella oxytoca NOT DETECTED NOT DETECTED   Klebsiella pneumoniae NOT DETECTED NOT DETECTED   Proteus species NOT DETECTED NOT DETECTED   Serratia marcescens NOT DETECTED NOT DETECTED   Haemophilus influenzae NOT DETECTED NOT DETECTED   Neisseria meningitidis NOT DETECTED NOT DETECTED   Pseudomonas aeruginosa NOT DETECTED NOT DETECTED   Candida albicans NOT DETECTED NOT DETECTED   Candida glabrata NOT DETECTED NOT DETECTED   Candida krusei NOT DETECTED NOT DETECTED   Candida parapsilosis NOT DETECTED NOT DETECTED   Candida tropicalis NOT DETECTED NOT DETECTED    Name of physician (or Provider) Contacted: Dr. Ladona Ridgelaylor  Changes to prescribed antibiotics required: None - likely contaminant. To continue Rocephin  + Azithromycin for CAP  [name], [name] 05/07/2016  1:37 PM

## 2016-05-07 NOTE — Progress Notes (Signed)
Md notified about status, Neb treatment and Chest Xray completed.

## 2016-05-07 NOTE — Progress Notes (Signed)
SLP Cancellation Note  Patient Details Name: Dolan AmenDaisy Axtell MRN: [id number] DOB: 1916/09/17   Cancelled treatment:       Reason Eval/Treat Not Completed: Fatigue/lethargy limiting ability to participate. Spoke at length with patient's daughter(Jill Dimas AguasHoward) this morning and reviewed family's wishes at this time (Ms. Dimas AguasHoward and her sister are both POA's). She primarily wants her Mom to be comfortable and when clinician described MBS procedure as something that could possibly be done down the road, Ms. Dimas AguasHoward stated that she does not think her mother would be able to physically do that without discomfort and declined that an MBS would be performed. SLP returned in afternoon and spoke with patient's other daughter, who stated that she and rest of family do not feel that patient needs any intervention from speech therapy right now, as they do not wish to make any changes regarding her diet consistencies.  SLP will s/o at this time but please reorder if patient's status improves and/or family is seeking out recommendations for diet consistencies, etc.    Angela NevinJohn T. Kaikoa Magro, MA, CCC-SLP Speech Therapy [facility]

## 2016-05-07 NOTE — Progress Notes (Signed)
Patient is asleep but arousalble lungs sound adventurous PRN Neb ordered will call Resp for a treatment and flutter valve.   Iv lasix ordered based on finding on CXR.

## 2016-05-07 NOTE — Progress Notes (Signed)
Internal Medicine Night Float Interim Progress Note  S: Called by nursing at 2100 for increased work of breathing. Patient reportedly had crackles and was satting 88% on 0.5L [place]. Patient was turned up to 2L [place] and given a breathing treatment. I went to see the patient, she is resting comfortably on 2L [place]. She admits to shortness of breath, but denies pain.  4 family members are at bedside.  O: Vitals:   05/07/16 1238 05/07/16 2010  BP: 117/81 (!) 119/91  Pulse: 98 (!) 46  Resp: 18 20  Temp: 97.5 F (36.4 C) 97.7 F (36.5 C)   Physical Exam Vitals:   05/07/16 1100 05/07/16 1238 05/07/16 2010 05/07/16 2119  BP:  117/81 (!) 119/91   Pulse:  98 (!) 46   Resp:  18 20   Temp:  97.5 F (36.4 C) 97.7 F (36.5 C)   TempSrc:  Oral Oral   SpO2: 96% 95% 96% (!) 88%  Weight:      Height:       General: Vital signs reviewed.  Patient is elderly, frail, in no acute distress and cooperative with exam.  Cardiovascular: Irregularly irregular Pulmonary/Chest: Inspiratory crackles in lower lung fields bilaterally, scattered rhonchi, no wheezes.  Extremities: No lower extremity edema bilaterally   CXR 1V: Increased effusions bilaterally, especially on right.  A/P:  Pulmonary Edema: Likely 2/2 fluid administration, although she received fluid yesterday. Discussed with family that we would given a small dose of lasix to help ease shortness of breath and pay close attention to BP. Currently, normotensive, but I did explain it possibly could drop her BP, but unlikely. Especially since our goal is comfort in the patient, we opted to try lasix.  -Lasix 10 mg IV once  Karlene LinemanAlexa Corah Willeford, DO PGY-3 Internal Medicine Resident Pager # [phone] 05/07/2016 10:43 PM

## 2016-05-07 NOTE — Progress Notes (Signed)
Patient c/o pain.  Morphine given per MAR. Will continue to monitor. A.Romy Ipock, RN

## 2016-05-07 NOTE — Progress Notes (Signed)
Subjective: [name] had an uneventful night. She was able to eat some dinner and slept throughout the night without disturbance. Her daughter was present on rounds this morning and feels [name] is doing better and has "had her color come back."   Objective: Vital signs in last 24 hours: Vitals:   05/21/2016 2047 05/11/2016 2211 05/07/16 0119 05/07/16 0653  BP:  (!) 130/96 106/80 105/75  Pulse: (!) 49 68 (!) 104 (!) 102  Resp: (!) 29 20 20 19   Temp:  97.4 F (36.3 C)  97.8 F (36.6 C)  TempSrc:  Axillary  Axillary  SpO2: 97% 92% 100% 99%  Weight:  33.7 kg (74 lb 4.8 oz)  35.3 kg (77 lb 14.4 oz)  Height:  5' (1.524 m)     Weight change:   Intake/Output Summary (Last 24 hours) at 05/07/16 0931 Last data filed at 05/07/16 0700  Gross per 24 hour  Intake              368 ml  Output                0 ml  Net              368 ml   BP 105/75 (BP Location: Right Arm)   Pulse (!) 102   Temp 97.8 F (36.6 C) (Axillary)   Resp 19   Ht 5' (1.524 m)   Wt 35.3 kg (77 lb 14.4 oz)   SpO2 99%   BMI 15.21 kg/m   General Appearance:    Asleep, resting comfortably, no distress, appears stated age  Lungs:     Coarse breath sounds bilaterally, respirations unlabored   Heart:    Regular rate and rhythm, S1 and S2 normal, no murmur, rub   or gallop  Skin:   Skin color improved from yesterday   Lab Results: Basic Metabolic Panel:  Recent Labs Lab 05/07/2016 1512 05/07/16 0338  NA 134* 140  K 4.7 5.1  CL 99* 109  CO2 24 23  GLUCOSE 262* 129*  BUN 27* 27*  CREATININE 0.80 0.68  CALCIUM 9.5 8.5*   CBC:  Recent Labs Lab 05/11/2016 1512  WBC 9.9  NEUTROABS 8.7*  HGB 12.4  HCT 37.7  MCV 89.8  PLT 163   Studies/Results: No new studies today  Medications: I have reviewed the patient's current medications. Scheduled Meds: . aspirin EC  81 mg Oral Daily  . atorvastatin  40 mg Oral QHS  . azithromycin  250 mg Oral q1800  . cefTRIAXone (ROCEPHIN)  IV  1 g Intravenous Q24H  .  citalopram  20 mg Oral Daily  . enoxaparin (LOVENOX) injection  30 mg Subcutaneous Q24H  . guaiFENesin  600 mg Oral BID  . levothyroxine  50 mcg Oral QAC breakfast  . oseltamivir  75 mg Oral Daily  . QUEtiapine  50 mg Oral QHS  . sodium chloride flush  3 mL Intravenous Q12H  . sodium chloride flush  3 mL Intravenous Q12H   Continuous Infusions: PRN Meds:.sodium chloride, acetaminophen, albuterol, morphine injection, sodium chloride flush Assessment/Plan: Principal Problem:   CAP (community acquired pneumonia) due to influenza A virus Active Problems:   Acute respiratory failure with hypoxia (HCC)   HLD (hyperlipidemia)   CAD (coronary artery disease)   Dementia  [name] is a 83 year old female who presented with 5 days of cough and decreased appetite, found to have influenza A and possible secondary left lower lobe pneumonia.  Influenza A:  - started on Tamiflu and will continue supportive measures.  Possible secondary left lower lobe pneumonia: - follow up blood cultures - continue ceftriaxone and azithromycin - Goal of care is to maximize comfort so she is not a candidate for BiPAP therapy or intubation  Atrial fibrillation: Tolerating rates up to the 110 range. Stable over night with gentle volume resuscitation.  Hypotension: Patient was hypotensive to the 70s/60s. Target MAP of 55 and O2 sat of 92%. Receive gentle fluid resuscitation overnight and has improved this morning.  Goals of care: DNR/DNI, comfort measures only  Disposition: Patient appears to be improving, though still ill. Will continue antibiotics and antiviral medications and assess patient day by day. Will want to see longer periods of stability and have her perk up a bit before discharge.  This is a Psychologist, occupational Note.  The care of the patient was discussed with Dr. Karma Greaser and the assessment and plan formulated with their assistance.  Please see their attached note for official documentation of the daily  encounter.   LOS: 1 day   Elmon Kirschner, Medical Student 05/07/2016, 9:31 AM

## 2016-05-07 NOTE — Progress Notes (Signed)
I was paged by pharmacy regarding the patient's blood culture. Her BCID was positive for 1 out of 2 for staph species. This most likely represents a contaminant. The patient is currently on antibiotics for the treatment of community-acquired pneumonia. At this time I do not think additional antibiotic changes are necessary. We will continue to treat for community-acquired pneumonia with ceftriaxone and azithromycin. We'll continue to monitor the patient clinically

## 2016-05-08 DIAGNOSIS — J09X2 Influenza due to identified novel influenza A virus with other respiratory manifestations: Secondary | ICD-10-CM

## 2016-05-08 MED ORDER — DILTIAZEM HCL 30 MG PO TABS: 15.0000 mg | ORAL_TABLET | Freq: Four times a day (QID) | ORAL | Status: DC | PRN

## 2016-05-08 NOTE — Progress Notes (Signed)
Subjective: [name] had worsening work of breathing and worsening crackles per nursing staff overnight. Her O2 requirement went from 0.5 L [place] to 2 L [place]. She was evaluated by night team and was in no distress. Thought to have pulmonary edema on CXR and gave dose of lasix. This morning she is still requiring 2 L [place]. She is in no acute distress. Denies any complaints today. Daughter at bedside reports she is doing better this morning than she was last night.   Objective: Vital signs in last 24 hours: Vitals:   05/07/16 1238 05/07/16 2010 05/07/16 2119 05/08/16 0622  BP: 117/81 (!) 119/91  (!) 141/91  Pulse: 98 (!) 46  (!) 115  Resp: 18 20  14   Temp: 97.5 F (36.4 C) 97.7 F (36.5 C)  97.7 F (36.5 C)  TempSrc: Oral Oral  Oral  SpO2: 95% 96% (!) 88% 97%  Weight:      Height:       Weight change:   Intake/Output Summary (Last 24 hours) at 05/08/16 0732 Last data filed at 05/07/16 1700  Gross per 24 hour  Intake               50 ml  Output                0 ml  Net               50 ml    Physical Exam  Constitutional:  Thin, frail, cachetic elderly female resting comfortably in bed in no acute distress  Cardiovascular:  Irregularly irregular rhythm, mildly tachycardic  Pulmonary/Chest: Effort normal.  Course rales in bilateral lung fields. Worst at lung bases with inspiratory crackles. No wheezes present.   Abdominal: Soft. Bowel sounds are normal.  Musculoskeletal: She exhibits no edema.  Skin: Skin is warm and dry.   Medications: I have reviewed the patient's current medications. Scheduled Meds: . aspirin EC  81 mg Oral Daily  . atorvastatin  40 mg Oral QHS  . azithromycin  250 mg Oral q1800  . cefTRIAXone (ROCEPHIN)  IV  1 g Intravenous Q24H  . citalopram  20 mg Oral Daily  . enoxaparin (LOVENOX) injection  30 mg Subcutaneous Q24H  . guaiFENesin  400 mg Oral Q8H  . levothyroxine  50 mcg Oral QAC breakfast  . oseltamivir  30 mg Oral Daily  . QUEtiapine  50 mg Oral  QHS  . sodium chloride flush  3 mL Intravenous Q12H  . sodium chloride flush  3 mL Intravenous Q12H   Continuous Infusions: PRN Meds:.sodium chloride, acetaminophen, albuterol, morphine injection, sodium chloride flush Assessment/Plan:  CAP secondary to Influenza A: Respiratory effort and oxygen saturations worsened overnight. O2 requirement went from 0.5 L > 2 L . She is maintaining O2 sats currently on the 2L. In no acute distress with no increased work of breathing. She was resting comfortably this morning and easily awoke and was cooperative with exam. CXR done last night showed increased bilateral pulmonary infiltrates. Difficult to interpret as patient is very rotated but most likely this represents increased pulmonary edema after receiving fluids upon admission. BP is stable and in the 140s this morning. She remains in A. Fib with rate last documented 115 but she appears to be staying <110 for the most part. A. Fib may be contributing to her pulmonary edema.  Blood cultures have returned with staph species (non staph aureus) in 1 of 2 blood cultures. This likely represents a contaminant. Patient  has no hardware (artifical valves, knee replacement, etc) that would put her at higher risk for infection. Currently on Ceftriaxone which should cover for any potential staph species. -Will continue Ceftriaxone, Azithro and Tamiflu, day 3 -Wean O2 as able to maintain sats >92% -Supportive care  -Discussion with the daughters informs us that the goals of care to keep her comfortable and therefore she would not be a candidate for BiPAP therapy or intubation.  Atrial Fibrillation: Heart rates in the low 100s and staying below 110. Will try to avoid any additional nodal blocking agents unless HR is consistently >110.   Hypotension likely secondary to volume depletion:  Resolved. SBP in the 140s. Likely has some pulmonary edema due to volume overload secondary to volume resuscitation.    Dementia:    Seroquel 50 mg bid  Dispo: Disposition is deferred at this time, awaiting improvement of current medical problems.  Anticipated discharge in approximately 1-2 day(s).   The patient does have a current PCP (Pcp Not In System) and [facility] after discharge.  The patient does not have transportation limitations that hinder transportation to clinic appointments.  .Services Needed at time of discharge: Y = Yes, Blank = No PT:   OT:   RN:   Equipment:   Other:     LOS: 2 days   Valentino NoseNathan Brandis Wixted, MD IMTS PGY-1 [phone] 05/08/2016, 7:32 AM

## 2016-05-08 NOTE — Progress Notes (Signed)
Internal Medicine Attending  Date: 05/08/2016  Patient name: Tanya AmenDaisy [name] Medical record number: [id number] Date of birth: [birth year] Age: 83 y.o. Gender: female  I saw and evaluated the patient. I reviewed the resident's note by Dr. Karma GreaserBoswell and I agree with the resident's findings and plans as documented in his progress note.  Apparently last night she had increased work of breathing. The chest x-ray was obtained but was very rotated. When compared to the chest x-ray on admission the left upper lung field infiltrate appeared slightly improved but there was a marked increase in the right basilar infiltrate and what I believe is bilateral small pleural effusions. Overnight she was given a small dose of Lasix with improvement in her dyspnea. Her blood pressure has now rebounded and she is slightly more tachycardic. We will therefore start a low-dose nodal agent in hopes of controlling her ventricular rate to less than 110. We are continuing the antibiotics and Tamiflu for the influenza A pneumonia and secondary pneumonia. We are also continuing to follow-up on the blood cultures drawn from the right upper extremity. Thus far, only the blood cultures drawn from the left IV site are positive and may represent contamination. When I asked her how she was doing she stated she was feeling better and denied any pain. In addition to the anti-infectives we will continue current supportive care with the goal being comfort. Given her age she is fragile and thus seriously ill.  I expect she will require several more days of inpatient hospital care.

## 2016-05-08 NOTE — Progress Notes (Signed)
PT Cancellation Note/Discharge  Patient Details Name: [name] MRN: [id number] DOB: Dolan Amen07/01/1917   Cancelled Treatment:    Reason Eval/Treat Not Completed: Other (comment).  Per RN, goals for this pt right now are comfort care.  She recommended PT sign off.  Please re-order us if pt seems to be making a recovery.    Thanks,    Rollene Rotundaebecca B. Emmani Lesueur, PT, DPT [phone]   05/08/2016, 11:52 AM

## 2016-05-08 NOTE — Progress Notes (Signed)
Pt is alert off and on with family at bedside, no distress with 02 N/C at baseline.

## 2016-05-09 DIAGNOSIS — F0390 Unspecified dementia without behavioral disturbance: Secondary | ICD-10-CM

## 2016-05-09 DIAGNOSIS — Z7189 Other specified counseling: Secondary | ICD-10-CM

## 2016-05-09 DIAGNOSIS — I4891 Unspecified atrial fibrillation: Secondary | ICD-10-CM

## 2016-05-09 DIAGNOSIS — Z515 Encounter for palliative care: Secondary | ICD-10-CM

## 2016-05-09 DIAGNOSIS — J09X1 Influenza due to identified novel influenza A virus with pneumonia: Secondary | ICD-10-CM

## 2016-05-09 LAB — CULTURE, BLOOD (ROUTINE X 2)

## 2016-05-09 MED ORDER — ONDANSETRON 4 MG PO TBDP: 4.0000 mg | ORAL_TABLET | Freq: Four times a day (QID) | ORAL | Status: DC | PRN

## 2016-05-09 MED ORDER — ACETAMINOPHEN 325 MG PO TABS: 650.0000 mg | ORAL_TABLET | Freq: Four times a day (QID) | ORAL | Status: DC | PRN

## 2016-05-09 MED ORDER — GLYCOPYRROLATE 0.2 MG/ML IJ SOLN
0.2000 mg | INTRAMUSCULAR | Status: DC | PRN
  Filled 2016-05-09: qty 1

## 2016-05-09 MED ORDER — MORPHINE SULFATE (CONCENTRATE) 10 MG/0.5ML PO SOLN
5.0000 mg | ORAL | Status: DC | PRN
  Administered 2016-05-09: 5 mg via ORAL
  Filled 2016-05-09: qty 0.5

## 2016-05-09 MED ORDER — GLYCOPYRROLATE 0.2 MG/ML IJ SOLN: 0.2000 mg | INTRAMUSCULAR | Status: DC | PRN

## 2016-05-09 MED ORDER — HALOPERIDOL LACTATE 5 MG/ML IJ SOLN: 0.5000 mg | INTRAMUSCULAR | Status: DC | PRN

## 2016-05-09 MED ORDER — HALOPERIDOL LACTATE 2 MG/ML PO CONC
0.5000 mg | ORAL | Status: DC | PRN
Start: 2016-05-09 — End: 2016-05-10
  Administered 2016-05-09: 0.5 mg via SUBLINGUAL
  Filled 2016-05-09 (×3): qty 0.3

## 2016-05-09 MED ORDER — MORPHINE SULFATE (CONCENTRATE) 10 MG/0.5ML PO SOLN
5.0000 mg | ORAL | Status: DC | PRN
Start: 2016-05-09 — End: 2016-05-10
  Administered 2016-05-10 (×2): 5 mg via SUBLINGUAL
  Filled 2016-05-09 (×2): qty 0.5

## 2016-05-09 MED ORDER — HALOPERIDOL 0.5 MG PO TABS
0.5000 mg | ORAL_TABLET | ORAL | Status: DC | PRN
Start: 2016-05-09 — End: 2016-05-10
  Filled 2016-05-09: qty 1

## 2016-05-09 MED ORDER — ONDANSETRON HCL 4 MG/2ML IJ SOLN: 4.0000 mg | Freq: Four times a day (QID) | INTRAMUSCULAR | Status: DC | PRN

## 2016-05-09 MED ORDER — BIOTENE DRY MOUTH MT LIQD: 15.0000 mL | OROMUCOSAL | Status: DC | PRN

## 2016-05-09 MED ORDER — ACETAMINOPHEN 650 MG RE SUPP
650.0000 mg | Freq: Four times a day (QID) | RECTAL | Status: DC | PRN
  Filled 2016-05-09: qty 1

## 2016-05-09 MED ORDER — POLYVINYL ALCOHOL 1.4 % OP SOLN: 1.0000 [drp] | Freq: Four times a day (QID) | OPHTHALMIC | Status: DC | PRN

## 2016-05-09 MED ORDER — GLYCOPYRROLATE 1 MG PO TABS
1.0000 mg | ORAL_TABLET | ORAL | Status: DC | PRN
  Filled 2016-05-09: qty 1

## 2016-05-09 NOTE — Clinical Social Work Note (Signed)
Clinical Social Work Assessment  Patient Details  Name: [name] MRN: [id number] Date of Birth: [birth year]  Date of referral:  05/09/16               Reason for consult:  Facility Placement, Discharge Planning, End of Life/Hospice                Permission sought to share information with:  Facility Sport and exercise psychologist, Family Supports Permission granted to share information::  Yes, Verbal Permission Granted  Name::     Nicanor Alcon  Agency::  Residential Hospice  Relationship::  Daughter  Contact Information:  [phone]  Housing/Transportation Living arrangements for the past 2 months:  [place] of Information:  Medical Team, Adult Children Patient Interpreter Needed:  None Criminal Activity/Legal Involvement Pertinent to Current Situation/Hospitalization:  No - Comment as needed Significant Relationships:  Adult Children Lives with:  Other (Comment) (24/7 caregiver) Do you feel safe going back to the place where you live?  No Need for family participation in patient care:  Yes (Comment)  Care giving concerns:  Palliative recommending inpatient hospice.   Social Worker assessment / plan:  CSW met with patient. Patient oriented to self only. Daughter, Sharee Pimple, at bedside. CSW introduced role and inquired about interest in [facility]. Patient's daughter confirmed. Referral made to [facility]. Per hospital liason, they will not have a bed for several days. CSW provided patient's daughter with list of other facilities as a back up plan. No further concerns. CSW encouraged patient's daughter to contact CSW as needed. CSW will continue to follow patient for support and facilitate discharge to inpatient hospice once medically stable.  Employment status:  Retired Nurse, adult PT Recommendations:  [place] / Referral to community resources:  Other (Comment Required) (Inpatient hospice)  Patient/Family's  Response to care:  Patient oriented to self only. Patient's daughter agreeable to [facility]. Patient's daughters supportive and involved in patient's care. Patient's daughter appreciated social work intervention.  Patient/Family's Understanding of and Emotional Response to Diagnosis, Current Treatment, and Prognosis:  Patient oriented to self only. Patient's daughter has a good understanding of reason for admission and prognosis. Patient's daughter appears happy with hospital care.  Emotional Assessment Appearance:  Appears stated age Attitude/Demeanor/Rapport:  Other (Pleasant) Affect (typically observed):  Appropriate, Calm, Pleasant Orientation:  Oriented to Self Alcohol / Substance use:  Never Used Psych involvement (Current and /or in the community):  No (Comment)  Discharge Needs  Concerns to be addressed:  Care Coordination Readmission within the last 30 days:  No Current discharge risk:  Cognitively Impaired, Dependent with Mobility Barriers to Discharge:  Hospice Bed not available, Continued Medical Work up   Candie Chroman, LCSW 05/09/2016, 2:58 PM

## 2016-05-09 NOTE — Consult Note (Signed)
Consultation Note Date: 05/09/2016   Patient Name: [name]  DOB: 08/01/1916  MRN: [id number]  Age / Sex: 83 y.o., female  PCP: Pcp Not In System Referring Physician: Axel Filler, MD  Reason for Consultation: Establishing goals of care  HPI/Patient Profile: 83 y.o. female  with past medical history of coronary artery disease (s/p bypass), HTN, HLD, CVA, and dementia admitted on 05/11/2016 with difficulty breathing. Workup revealed Influenza A and pneumonia.   Clinical Assessment and Goals of Care: Met with patient's three daughters at the bedside.  Prior to admission patient was living in a home with a 24 hour caregiver. Over the last six months she has lost significant amount of weight (now down to 70lbs from 97), has stopped eating without prompting or being fed, has been requiring total care for ADL's and has significant overall decline in her cognition with decreases in vocabulary and orientation. Her daughters tell me that they believe she is at end of life and their main desire is for her to be comfortable. Patient has told them she "is ready to go see the lord" and does not want life prolonging measures.  We discussed that PT eval shows that she would like not benefit from rehab and there is little hope for increasing her level of functioning. Her daughters say she would not ever want to go to a nursing home. They feel that even if she recovers from pneumonia she would not have increased quality of life. Discussed option of transitioning to comfort care- stopping medications nonessential to comfort including IV antibiotics. All are in agreement that this would be the best path for their Mom and give her a dignified peaceful death. Their main concern is that she does not suffer. She has been having some intermittent abdominal pain- unknown etiology, they do not want workup, would like aggressive symptom  management only.  They request referral for residential hospice.   Primary Decision Maker NEXT OF KIN - patient's children    SUMMARY OF RECOMMENDATIONS -Transition to full comfort care ---SOB:   -Morphine intensol ('10mg'$ /0.25m) '5mg'$  SL q1hr prn moderate SOB and as  --Anxiety/Agitation:  -lorazepam '1mg'$  SL or IV q 4 hours prn  -haloperidol .'5mg'$  PO, IV, SL q 4hrs prn --Pulmonary edema/Audible Terminal Secretions:  -1 mg Robinul po or .'2mg'$  IV or SL q 4 hrs prn --Stop IV fluids --Stop O2 via [place] --Stop all meds that do not focus on comfort --Consult to SW for residential hospice placement     Code Status/Advance Care Planning:  DNR    Symptom Management:   As above  Palliative Prophylaxis:   Frequent Pain Assessment  Additional Recommendations (Limitations, Scope, Preferences):  Full Comfort Care  Psycho-social/Spiritual:   Desire for further Chaplaincy support:Yes  Additional Recommendations: Education on Hospice  Prognosis:    < 2 weeks d/t no po intake, transition to comfort measures only in this 83yo patient with influenza A and pneumonia in setting of cardiac dysfunction  Discharge Planning: Hospice facility  Primary Diagnoses: Present  on Admission: . CAP (community acquired pneumonia) due to influenza A virus   I have reviewed the medical record, interviewed the patient and family, and examined the patient. The following aspects are pertinent.  Past Medical History:  Diagnosis Date  . Hypercholesteremia   . Hypertension    Social History   Social History  . Marital status: Married    Spouse name: N/A  . Number of children: N/A  . Years of education: N/A   Social History Main Topics  . Smoking status: Never Smoker  . Smokeless tobacco: Never Used  . Alcohol use No  . Drug use: No  . Sexual activity: Not Asked   Other Topics Concern  . None   Social History Narrative  . None   No family history on file. Scheduled Meds: . citalopram   20 mg Oral Daily  . guaiFENesin  400 mg Oral Q8H  . QUEtiapine  50 mg Oral QHS  . sodium chloride flush  3 mL Intravenous Q12H  . sodium chloride flush  3 mL Intravenous Q12H   Continuous Infusions: PRN Meds:.sodium chloride, acetaminophen **OR** acetaminophen, albuterol, antiseptic oral rinse, glycopyrrolate **OR** glycopyrrolate **OR** glycopyrrolate, haloperidol **OR** haloperidol **OR** haloperidol lactate, morphine injection, morphine CONCENTRATE **OR** morphine CONCENTRATE, ondansetron **OR** ondansetron (ZOFRAN) IV, polyvinyl alcohol, sodium chloride flush Medications Prior to Admission:  Prior to Admission medications   Medication Sig Start Date End Date Taking? Authorizing Provider  acetaminophen (TYLENOL) 325 MG tablet Take 325 mg by mouth every 6 (six) hours as needed (for pain).   Yes Historical Provider, MD  aspirin 81 MG tablet Take 81 mg by mouth daily.     Yes Historical Provider, MD  atorvastatin (LIPITOR) 80 MG tablet Take 40 mg by mouth at bedtime.    Yes Historical Provider, MD  citalopram (CELEXA) 20 MG tablet Take 20 mg by mouth daily.     Yes Historical Provider, MD  levothyroxine (SYNTHROID, LEVOTHROID) 50 MCG tablet Take 50 mcg by mouth daily.     Yes Historical Provider, MD  lisinopril (PRINIVIL,ZESTRIL) 5 MG tablet Take 5 mg by mouth daily.   Yes Historical Provider, MD  magnesium hydroxide (MILK OF MAGNESIA) 400 MG/5ML suspension Take 5 mLs by mouth daily as needed for mild constipation.   Yes Historical Provider, MD  Multiple Vitamins-Minerals (CENTRUM SILVER 50+WOMEN) TABS Take 1 tablet by mouth daily with breakfast.   Yes Historical Provider, MD  Phenylephrine-DM-GG-APAP (MUCINEX FAST-MAX SEVERE COLD) 5-10-200-325 MG/10ML LIQD Take 5 mLs by mouth 2 (two) times daily as needed (for congestion/phlegm).    Yes Historical Provider, MD  QUEtiapine (SEROQUEL) 50 MG tablet Take 50 mg by mouth at bedtime.    Yes Historical Provider, MD   Allergies  Allergen Reactions  .  Tape Other (See Comments)    Patient's skin is VERY THIN AND WILL TEAR AND BRUISE VERY EASILY  . Sulfa Antibiotics Rash    Had allergy for a long time   Review of Systems  Unable to perform ROS: Acuity of condition    Physical Exam  Constitutional:  cachetic  Cardiovascular:  Tachycardic, irregular rhythm  Pulmonary/Chest: Effort normal. She has wheezes. She has rales.  Abdominal: Soft. Bowel sounds are normal.  Musculoskeletal:  Hands contracted  Neurological:  Lethargic, not arousing to voice  Skin: Skin is warm and dry. There is pallor.    Vital Signs: BP 112/68 (BP Location: Right Arm)   Pulse (!) 132   Temp 97.8 F (36.6 C) (Oral)  Resp 18   Ht 5' (1.524 m)   Wt 31.9 kg (70 lb 6.4 oz) Comment: bedscale  SpO2 97%   BMI 13.75 kg/m  Pain Assessment: No/denies pain   Pain Score: 0-No pain   SpO2: SpO2: 97 % O2 Device:SpO2: 97 % O2 Flow Rate: .O2 Flow Rate (L/min): 2 L/min  IO: Intake/output summary: No intake or output data in the 24 hours ending 05/09/16 1405  LBM: Last BM Date: 05/08/16 Baseline Weight: Weight: 43.1 kg (95 lb) Most recent weight: Weight: 31.9 kg (70 lb 6.4 oz) (bedscale)     Palliative Assessment/Data: PPS: 10%   Flowsheet Rows   Flowsheet Row Most Recent Value  Intake Tab  Referral Department  -- [internal medicine]  Unit at Time of Referral  Cardiac/Telemetry Unit  Palliative Care Primary Diagnosis  Sepsis/Infectious Disease  Date Notified  05/08/16  Palliative Care Type  New Palliative care  Reason for referral  End of Life Care Assistance  Date of Admission  05/07/2016  Date first seen by Palliative Care  05/09/16  # of days IP prior to Palliative referral  2  Clinical Assessment  Palliative Performance Scale Score  10%  Psychosocial & Spiritual Assessment  Social Work Systems developer patient/family wishes with healthcare team, Participated in [employer], Advance care planning, Education on [place]  Patient/Family meeting held?  Yes  Who was at the meeting?  Three daughters- [name] and [facility]  Palliative Care Outcomes  Improved non-pain symptom therapy, Counseled regarding hospice, Provided advance care planning, Changed to focus on comfort, Transitioned to hospice, Provided end of life care assistance, Clarified goals of care  Patient/Family wishes: Interventions discontinued/not started   Mechanical Ventilation, BiPAP, Hemodialysis, Transfusion, Tube feedings/TPN, Antibiotics, Vasopressors, NIPPV, Trach, PEG  Palliative Care follow-up planned  Yes, Facility      Thank you for this consult. Palliative medicine will continue to follow and assist as needed.   Time Total: 90 minutes Greater than 50%  of this time was spent counseling and coordinating care related to the above assessment and plan.  Signed by: Mariana Kaufman, AGNP-C Palliative Medicine    Please contact Palliative Medicine Team phone at [phone] for questions and concerns.  For individual provider: See Shea Evans

## 2016-05-09 NOTE — Discharge Summary (Signed)
  Name: Tanya AmenDaisy [name] MRN: [id number] DOB: 07-23-1916 83 y.o.  Date of Admission: 05/17/2016  1:49 PM Date of Discharge: 05/16/2016 Attending Physician: Blanch MediaElizabeth Butcher, MD  Discharge Diagnosis: Principal Problem:   CAP (community acquired pneumonia) due to influenza A virus Active Problems:   Acute respiratory failure with hypoxia (HCC)   HLD (hyperlipidemia)   CAD (coronary artery disease)   Dementia without behavioral disturbance   Atrial fibrillation (HCC)   Influenza due to identified novel influenza A virus with other respiratory manifestations   Palliative care by specialist   Terminal care   Advance care planning   Influenza with pneumonia  Cause of death: Respiratory failure secondary to Influenza A and pneumonia Time of death: 4:19AM on 05/21/2016  Disposition and follow-up:   Ms.[name] [facility].    Hospital Course: Patient was admitted with 5 days of cough, decreased activity and appetite. She was found to be influenza A positive with a possible secondary pneumonia as seen on chest Xray. She was treated with fluids and 3 days with Tamiflu, azithromycin and ceftriaxone at which time the family decide to change goals of care to providing comfort only with no life prolonging measures.  [name] ultimately passed away from respiratory failure secondary to Influenza A.  Signed: Valentino NoseNathan Myleigh Amara, MD 05/09/2016, 11:17 AM

## 2016-05-09 NOTE — Progress Notes (Signed)
[facility] RN update  Received request from Tanya MyrtleLCSW  [name] for [facility]. Contacted daughter [name] to confirm interest and explain services then made aware that no bed availability today.  [facility] will touch base with her in morning to follow up about possible bed availability.  Thank you for the referral. Please contact [facility] with any updates or questions as needed at [phone].   Roda ShuttersSherry Gibson, RN  [facility]  [facility] schedule and contact numbers on Amion.

## 2016-05-09 NOTE — Evaluation (Signed)
Physical Therapy Evaluation Patient Details Name: Tanya AmenDaisy Paolillo MRN: [id number] DOB: [birth year] Today's Date: 05/09/2016   History of Present Illness  Frail 83 year old woman with complex medical history now hospital day #2 with influenza pneumonia  Clinical Impression  Patient seen for evaluation, limited to bed bound evaluation at this time given patients ability to participate and fatigue. Spoke with daughter at bedside. Patient very weak at this time, PTA patient was ambulating with RW at supervision levels. Based on current condition feel it may be unlikely of immediate return to this state. Patient does appear to have a high degree of discomfort with ROM and mobility to EOB, however, patient was able to tolerate sitting EOB briefly. Patient does show increased distress via elevated HR, moaning with pain intermittently, and labored/cracking breath sounds throughout session. Will trial PT services if family so desires pending patients ability to tolerate activity but strongly encourage consideration of palliative services/hospice if deemed appropriate. Will follow.    Follow Up Recommendations SNF (vs Hospice pending GOC)    Equipment Recommendations  Wheelchair (measurements PT);Wheelchair cushion (measurements PT)    Recommendations for Other Services       Precautions / Restrictions Precautions Precautions: Fall      Mobility  Bed Mobility Overal bed mobility: Needs Assistance Bed Mobility: Rolling;Supine to Sit;Sit to Supine Rolling: Max assist   Supine to sit: Total assist Sit to supine: Total assist   General bed mobility comments: patient able to reach with UE for rail but otherwise max assist for rolling, total assist for supine <> sit  Transfers                    Ambulation/Gait                Stairs            Wheelchair Mobility    Modified Rankin (Stroke Patients Only)       Balance Overall balance assessment: Needs  assistance Sitting-balance support: Bilateral upper extremity supported Sitting balance-Leahy Scale: Fair Sitting balance - Comments: able to sit EOB self supported for ~3 minutes, noted to have increased effort, WOB, and HR                                     Pertinent Vitals/Pain Pain Assessment: Faces Faces Pain Scale: Hurts even more Pain Location: shoulders and legs with movement Pain Descriptors / Indicators: Grimacing;Guarding;Moaning Pain Intervention(s): Repositioned;Monitored during session;Limited activity within patient's tolerance;Premedicated before session    Home Living Family/patient expects to be discharged to:: Unsure Living Arrangements: Non-relatives/Friends Available Help at Discharge: Personal care attendant;Family Type of Home: House                Prior Function Level of Independence: Needs assistance   Gait / Transfers Assistance Needed: was ambulating with a RW           Hand Dominance        Extremity/Trunk Assessment   Upper Extremity Assessment Upper Extremity Assessment: Generalized weakness (limited AROM, very frail, gross 2+/5 strength)    Lower Extremity Assessment Lower Extremity Assessment: Generalized weakness (limited AROM, very frail, gross 2/5 strength LEs)       Communication   Communication: HOH  Cognition Arousal/Alertness: Awake/alert Behavior During Therapy: Flat affect Overall Cognitive Status: History of cognitive impairments - at baseline  General Comments      Exercises     Assessment/Plan    PT Assessment Patient needs continued PT services (* will trial PT pending patient recovery and GOC)  PT Problem List Decreased strength;Decreased range of motion;Decreased balance;Decreased activity tolerance;Decreased mobility;Decreased cognition;Decreased safety awareness;Cardiopulmonary status limiting activity;Pain          PT Treatment Interventions Functional  mobility training;Therapeutic activities;Therapeutic exercise;Balance training;Patient/family education    PT Goals (Current goals can be found in the Care Plan section)  Acute Rehab PT Goals Patient Stated Goal: none stated PT Goal Formulation: With patient/family Time For Goal Achievement: 05/23/16 Potential to Achieve Goals: Poor    Frequency Min 1X/week   Barriers to discharge        Co-evaluation               End of Session   Activity Tolerance: Patient limited by fatigue;Patient limited by pain Patient left: in bed;with call bell/phone within reach;with family/visitor present Nurse Communication: Mobility status         Time: 1140-1156 PT Time Calculation (min) (ACUTE ONLY): 16 min   Charges:   PT Evaluation $PT Eval Moderate Complexity: 1 Procedure     PT G Codes:        Fabio Asa 05-29-2016, 1:55 PM Charlotte Crumb, PT DPT  [phone]

## 2016-05-09 NOTE — Progress Notes (Signed)
Subjective: [name] is doing well today. Since last night, she was alert and able to eat some food for dinner, then rested comfortably throughout the night. Her daughter was available during rounds and feels that she is improving. She was able to respond to minimal agitation this morning and cooperated with exam. She is on day 4 of 5 for azithromycin, ceftriaxone, and Tamiflu.   Objective: Vital signs in last 24 hours: Vitals:   05/08/16 0700 05/08/16 1233 05/08/16 2038 05/09/16 0725  BP:  (!) 138/108 101/82 135/61  Pulse:  (!) 115 98 (!) 119  Resp:  18 16 16   Temp:  98.2 F (36.8 C) 98 F (36.7 C) 97.9 F (36.6 C)  TempSrc:  Oral Oral Oral  SpO2:  98% 100% 100%  Weight: 32.4 kg (71 lb 6.4 oz)   31.9 kg (70 lb 6.4 oz)  Height:       Weight change:  No intake or output data in the 24 hours ending 05/09/16 0947 BP 135/61 (BP Location: Right Arm)   Pulse (!) 119   Temp 97.9 F (36.6 C) (Oral)   Resp 16   Ht 5' (1.524 m)   Wt 31.9 kg (70 lb 6.4 oz) Comment: bedscale  SpO2 100%   BMI 13.75 kg/m   General Appearance:    Thin, frail, cachetic elderly female resting comfortably in bed in no acute distress  Lungs:     Coarse lung sounds throughout, improved over interval, no wheezing present   Heart:    Tachycardic, S1 and S2 normal, no murmur, rub   or gallop  Extremities:   Thin extremities, no cyanosis or edema  Skin:   Skin is warm and dry   Lab Results: No new labs over interval period  Micro Results: Blood cultures (started 05/15/2016) - 1 of 2 positive for Coag negative Staphylococcus, suggestive of contaminant Urine culture (started 05/26/2016) - negative  Studies/Results: No new radiographic studies.  Medications: I have reviewed the patient's current medications. Scheduled Meds: . aspirin EC  81 mg Oral Daily  . atorvastatin  40 mg Oral QHS  . azithromycin  250 mg Oral q1800  . cefTRIAXone (ROCEPHIN)  IV  1 g Intravenous Q24H  . citalopram  20 mg Oral Daily  .  enoxaparin (LOVENOX) injection  30 mg Subcutaneous Q24H  . guaiFENesin  400 mg Oral Q8H  . levothyroxine  50 mcg Oral QAC breakfast  . oseltamivir  30 mg Oral Daily  . QUEtiapine  50 mg Oral QHS  . sodium chloride flush  3 mL Intravenous Q12H  . sodium chloride flush  3 mL Intravenous Q12H   Continuous Infusions: PRN Meds:.sodium chloride, acetaminophen, albuterol, diltiazem, morphine injection, sodium chloride flush Assessment/Plan: Principal Problem:   CAP (community acquired pneumonia) due to influenza A virus Active Problems:   Acute respiratory failure with hypoxia (HCC)   HLD (hyperlipidemia)   CAD (coronary artery disease)   Dementia without behavioral disturbance   Atrial fibrillation (HCC)   Influenza due to identified novel influenza A virus with other respiratory manifestations  CAP secondary to Influenza A: Patient's respiratory status was improved overnight and this morning. We will see how she does today having weaned the oxygen off. - Will continue Ceftriaxone, Azithro and Tamiflu, day 4 - Off O2, tolerate sats >92%  Atrial Fibrillation: Heart rates in the 90s-110s this AM. Concerned yesterday Afib was contributing to worsening respiratory status. On low dose diltiazem. Will continue to monitor.  Hypotension likely secondary to  volume depletion:  Resolved. SBP in the 130s. Likely experienced some pulmonary edema due to volume overload secondary to volume resuscitation, this too appears to be improving.   Dementia:  Seroquel 50 mg bid  Dispo: Will request evaluation by PT for rehabilitation needs for safe discharge planning.  Anticipated discharge in approximately 1-2 day(s).    This is a Psychologist, occupational Note.  The care of the patient was discussed with Dr. Tasia Catchings and the assessment and plan formulated with their assistance.  Please see their attached note for official documentation of the daily encounter.   LOS: 2 days   Elmon Kirschner, Medical  Student 05/09/2016, 9:47 AM

## 2016-05-09 NOTE — Progress Notes (Signed)
Pt is asleep with daughter at bedside had a restful night with no distress. Vitals stable

## 2016-05-09 NOTE — Progress Notes (Addendum)
Internal Medicine Attending:   I saw and examined the patient. I reviewed the resident's note and I agree with the resident's findings and plan as documented in the resident's note.  Frail 83 year old woman now hospital day #2 with influenza pneumonia. Daughter is at her bedside and tells us that she had a good night's. Patient eyes any pain or discomfort. Says she feels a little bit better. Has not yet been out of bed. Plan is to wean oxygen as able. Continue 5 day course of ceftriaxone, azithromycin, Tamiflu. Coag negative staph in one of two blood cultures is almost certainly a contaminant. PT consult today for placement. Patient lives at home with a 24-hour caregiver, but I doubt that she will be able to go there after this hospitalization. Anticipate likely transfer to subacute rehabilitation center today or tomorrow.

## 2016-05-09 NOTE — Progress Notes (Signed)
Subjective: Patient is doing well today. satting fine on room air during the interview. No events overnight.   Objective: Vital signs in last 24 hours: Vitals:   05/08/16 0700 05/08/16 1233 05/08/16 2038 05/09/16 0725  BP:  (!) 138/108 101/82 135/61  Pulse:  (!) 115 98 (!) 119  Resp:  18 16 16   Temp:  98.2 F (36.8 C) 98 F (36.7 C) 97.9 F (36.6 C)  TempSrc:  Oral Oral Oral  SpO2:  98% 100% 100%  Weight: 71 lb 6.4 oz (32.4 kg)   70 lb 6.4 oz (31.9 kg)  Height:       Weight change:  No intake or output data in the 24 hours ending 05/09/16 1100  Physical Exam  Constitutional:  Thin, frail, cachetic elderly female resting comfortably in bed in no acute distress  Cardiovascular:  Irregularly irregular rhythm, mildly tachycardic  Pulmonary/Chest: Effort normal.  On anterior exam, has course breathe sounds throughout. No wheezing. Some mild crackles on the bases.   Abdominal: Soft. Bowel sounds are normal.  Musculoskeletal: She exhibits no edema.  Skin: Skin is warm and dry.   Medications: I have reviewed the patient's current medications. Scheduled Meds: . aspirin EC  81 mg Oral Daily  . atorvastatin  40 mg Oral QHS  . azithromycin  250 mg Oral q1800  . cefTRIAXone (ROCEPHIN)  IV  1 g Intravenous Q24H  . citalopram  20 mg Oral Daily  . enoxaparin (LOVENOX) injection  30 mg Subcutaneous Q24H  . guaiFENesin  400 mg Oral Q8H  . levothyroxine  50 mcg Oral QAC breakfast  . oseltamivir  30 mg Oral Daily  . QUEtiapine  50 mg Oral QHS  . sodium chloride flush  3 mL Intravenous Q12H  . sodium chloride flush  3 mL Intravenous Q12H   Continuous Infusions: PRN Meds:.sodium chloride, acetaminophen, albuterol, diltiazem, morphine injection, sodium chloride flush Assessment/Plan:  CAP secondary to Influenza A: She was resting comfortably this morning and easily awoke and was cooperative with exam. She remains in A. Fib with rate 110's  A. Fib may be contributing to her pulmonary  edema.  Blood cultures have returned with staph species (non staph aureus) in 1 of 2 blood cultures. This likely represents a contaminant.  -Will continue Ceftriaxone, Azithro and Tamiflu, day 4 - satting fine on room air, will have her ambulate with PT and assess ambulatory sat -Supportive care  -Discussion with the daughters informs [place] that the goals of care to keep her comfortable and therefore she would not be a candidate for BiPAP therapy or intubation.  Atrial Fibrillation: Heart rates in the low 100s and staying below 110. Will try to avoid any additional nodal blocking agents unless HR is consistently >110.  - can do low dose dilt if HR remains high.   Hypotension likely secondary to volume depletion:  Resolved with fluids.    Dementia:  Seroquel 50 mg bid  Dispo: Disposition is deferred at this time, awaiting improvement of current medical problems.  Anticipated discharge in approximately 1-2 day(s).   The patient does have a current PCP (Pcp Not In System) and does need an [facility] hospital follow-up appointment after discharge.  The patient does not have transportation limitations that hinder transportation to clinic appointments.  .Services Needed at time of discharge: Y = Yes, Blank = No PT:   OT:   RN:   Equipment:   Other:     LOS: 2 days   Hyacinth Meeker, MD  05/09/2016, 11:00 AM

## 2016-05-09 NOTE — Discharge Instructions (Signed)
[name] was hospitalized and diagnosed with both influenza and community acquired pneumonia. She was treated with Tamiflu and two antibiotics called azithromycin and ceftriaxone. During her hospitalization, she had some episodes of difficulty breathing and had low blood pressure. She received oxygen and nebulized albuterol to help with her breathing and intravenous fluids to help with her low blood pressure. She may take some time to recover fully from her hospitalization. Please follow up with her PCP.   Influenza, Adult Influenza (the flu") is an infection in the lungs, nose, and throat (respiratory tract). It is caused by a virus. The flu causes many common cold symptoms, as well as a high fever and body aches. It can make you feel very sick. The flu spreads easily from person to person (is contagious). Getting a flu shot (influenza vaccination) every year is the best way to prevent the flu. Follow these instructions at home:  Take over-the-counter and prescription medicines only as told by your doctor.  Use a cool mist humidifier to add moisture (humidity) to the air in your home. This can make it easier to breathe.  Rest as needed.  Drink enough fluid to keep your pee (urine) clear or pale yellow.  Cover your mouth and nose when you cough or sneeze.  Wash your hands with soap and water often, especially after you cough or sneeze. If you cannot use soap and water, use hand sanitizer.  Stay home from work or school as told by your doctor. Unless you are visiting your doctor, try to avoid leaving home until your fever has been gone for 24 hours without the use of medicine.  Keep all follow-up visits as told by your doctor. This is important. How is this prevented?  Getting a yearly (annual) flu shot is the best way to avoid getting the flu. You may get the flu shot in late summer, fall, or winter. Ask your doctor when you should get your flu shot.  Wash your hands often or use hand  sanitizer often.  Avoid contact with people who are sick during cold and flu season.  Eat healthy foods.  Drink plenty of fluids.  Get enough sleep.  Exercise regularly. Contact a doctor if:  You get new symptoms.  You have:  Chest pain.  Watery poop (diarrhea).  A fever.  Your cough gets worse.  You start to have more mucus.  You feel sick to your stomach (nauseous).  You throw up (vomit). Get help right away if:  You start to be short of breath or have trouble breathing.  Your skin or nails turn a bluish color.  You have very bad pain or stiffness in your neck.  You get a sudden headache.  You get sudden pain in your face or ear.  You cannot stop throwing up. This information is not intended to replace advice given to you by your health care provider. Make sure you discuss any questions you have with your health care provider. Document Released: 12/29/2007 Document Revised: 08/27/2015 Document Reviewed: 01/13/2015 Elsevier Interactive Patient Education  2017 [employer]

## 2016-05-10 DIAGNOSIS — Z79899 Other long term (current) drug therapy: Secondary | ICD-10-CM

## 2016-05-10 DIAGNOSIS — J9601 Acute respiratory failure with hypoxia: Secondary | ICD-10-CM

## 2016-05-10 DIAGNOSIS — Z9981 Dependence on supplemental oxygen: Secondary | ICD-10-CM

## 2016-05-10 DIAGNOSIS — J1000 Influenza due to other identified influenza virus with unspecified type of pneumonia: Principal | ICD-10-CM

## 2016-05-10 MED ORDER — LORAZEPAM 1 MG PO TABS: 1.0000 mg | ORAL_TABLET | ORAL | Status: DC | PRN

## 2016-05-10 MED ORDER — MORPHINE SULFATE (PF) 2 MG/ML IV SOLN
1.0000 mg | INTRAVENOUS | Status: DC | PRN
  Filled 2016-05-10 (×2): qty 1

## 2016-05-10 MED ORDER — MORPHINE SULFATE (CONCENTRATE) 10 MG/0.5ML PO SOLN
10.0000 mg | ORAL | Status: DC | PRN
  Administered 2016-05-11: 10 mg via SUBLINGUAL
  Filled 2016-05-10: qty 0.5

## 2016-05-10 MED ORDER — MORPHINE SULFATE (CONCENTRATE) 10 MG/0.5ML PO SOLN
10.0000 mg | ORAL | Status: DC | PRN
  Administered 2016-05-11: 10 mg via ORAL
  Filled 2016-05-10: qty 0.5

## 2016-05-10 NOTE — Progress Notes (Signed)
Daily Progress Note   Patient Name: [name]       Date: 05/10/2016 DOB: 06-24-1916  Age: 83 y.o. MRN#: [id number] Attending Physician: Bartholomew Crews, MD Primary Care Physician: Pcp Not In System Admit Date: 05/29/2016  Reason for Consultation/Follow-up: Terminal Care  Subjective: Met with Sharee Pimple at bedside. They are awaiting residential hospice availability. Patient had some difficult moments yesterday. Some agitation, morphine helped a little, but not enough. Haldol increased agitation. Sharee Pimple tells me patient has used lorazepam in the past successfully. Sharee Pimple also feels oxygen decreased her agitation.  Review of Systems  Unable to perform ROS: Critical illness    Length of Stay: 3  Current Medications: Scheduled Meds:  . citalopram  20 mg Oral Daily  . guaiFENesin  400 mg Oral Q8H  . QUEtiapine  50 mg Oral QHS  . sodium chloride flush  3 mL Intravenous Q12H  . sodium chloride flush  3 mL Intravenous Q12H    Continuous Infusions:   PRN Meds: sodium chloride, acetaminophen **OR** acetaminophen, albuterol, antiseptic oral rinse, glycopyrrolate **OR** glycopyrrolate **OR** glycopyrrolate, LORazepam, morphine injection, morphine CONCENTRATE **OR** morphine CONCENTRATE, ondansetron **OR** ondansetron (ZOFRAN) IV, polyvinyl alcohol, sodium chloride flush  Physical Exam  Constitutional:  cachetic  Cardiovascular: Normal rate and regular rhythm.   Pulmonary/Chest: Effort normal and breath sounds normal.  Neurological:  Somnolent- opens eyes to voices, does not respond verbally            Vital Signs: BP 102/71 (BP Location: Left Arm)   Pulse (!) 54   Temp 97.5 F (36.4 C) (Axillary)   Resp 18   Ht 5' (1.524 m)   Wt 31.2 kg (68 lb 11.2 oz)   SpO2 94%   BMI 13.42 kg/m    SpO2: SpO2: 94 % O2 Device: O2 Device: Nasal Cannula O2 Flow Rate: O2 Flow Rate (L/min): 2 L/min  Intake/output summary:  Intake/Output Summary (Last 24 hours) at 05/10/16 1406 Last data filed at 05/10/16 0900  Gross per 24 hour  Intake                0 ml  Output                0 ml  Net  0 ml   LBM: Last BM Date: 05/08/16 Baseline Weight: Weight: 43.1 kg (95 lb) Most recent weight: Weight: 31.2 kg (68 lb 11.2 oz)       Palliative Assessment/Data: PPS: 10%    Flowsheet Rows   Flowsheet Row Most Recent Value  Intake Tab  Referral Department  -- [internal medicine]  Unit at Time of Referral  Cardiac/Telemetry Unit  Palliative Care Primary Diagnosis  Sepsis/Infectious Disease  Date Notified  05/08/16  Palliative Care Type  New Palliative care  Reason for referral  End of Life Care Assistance  Date of Admission  05/19/2016  Date first seen by Palliative Care  05/09/16  # of days IP prior to Palliative referral  2  Clinical Assessment  Palliative Performance Scale Score  10%  Psychosocial & Spiritual Assessment  Social Work Systems developer patient/family wishes with healthcare team, Participated in [employer], Advance care planning, Education on [place]  Patient/Family meeting held?  Yes  Who was at the meeting?  Three daughters- Deno Etienne and [facility]  Palliative Care Outcomes  Improved non-pain symptom therapy, Counseled regarding hospice, Provided advance care planning, Changed to focus on comfort, Transitioned to hospice, Provided end of life care assistance, Clarified goals of care  Patient/Family wishes: Interventions discontinued/not started   Mechanical Ventilation, BiPAP, Hemodialysis, Transfusion, Tube feedings/TPN, Antibiotics, Vasopressors, NIPPV, Trach, PEG  Palliative Care follow-up planned  Yes, Facility      Patient Active Problem List   Diagnosis Date Noted  . Palliative care by specialist   . Terminal  care   . Advance care planning   . Influenza due to identified novel influenza A virus with other respiratory manifestations   . Atrial fibrillation ([place])   . CAP (community acquired pneumonia) due to influenza A virus 05/10/2016  . Acute respiratory failure with hypoxia ([place]) 05/15/2016  . HLD (hyperlipidemia) 05/07/2016  . CAD (coronary artery disease) 05/19/2016  . Dementia without behavioral disturbance 05/19/2016    Palliative Care Assessment & Plan   Patient Profile: 83 y.o. female  with past medical history of coronary artery disease (s/p bypass), HTN, HLD, CVA, and dementia admitted on 05/17/2016 with difficulty breathing. Workup revealed Influenza A and pneumonia. Family has decided to make transition to comfort measures in light of her significant overall decline over the last year. Their priority is to assure peaceful, comfortable death.  Assessment/Recommendations/Plan   Increase morphine intensol to 11m q1hr prn- please call for increased frequency if needed  D/C Haldol  Start lorazepam 149mSL q4hr prn anxiety, agitation  Do not up titrate O2- oxgen is considered life prolonging, however, will continue [place] for now due to helping with agitation, do not check O2 sats. Treat symptoms of SOB with benzodiazepines and opioids. If these are not sufficient, please call for further orders.  Goals of Care and Additional Recommendations:  Limitations on Scope of Treatment: Full Comfort Care  Code Status:  DNR  Prognosis:   < 2 weeks  Discharge Planning:  Hospice facility  Care plan was discussed with daughter, JiSharee Pimple Thank you for allowing the Palliative Medicine Team to assist in the care of this patient.   Total time: 45 minutes  Greater than 50%  of this time was spent counseling and coordinating care related to the above assessment and plan.  KaMariana KaufmanAGNP-C Palliative Medicine   Please contact Palliative Medicine Team phone at [phone] questions  and concerns.

## 2016-05-10 NOTE — Consult Note (Signed)
   [facility]   05/10/2016  Dolan AmenDaisy [name] [birth year] [id number]   Chart reviewed for [employer] Medicare ACO Registry for [facility] management community needs.  Chart review reveals patient is currently for comfort care per chart and per nurse in progression report. No current [facility] needs noted at this time.  For questions, please contact:  Charlesetta ShanksVictoria Prestyn Stanco, RN BSN CCM [facility] Liaison  [phone] business mobile phone Toll free office [phone]

## 2016-05-10 NOTE — Progress Notes (Signed)
  Date: 05/10/2016  Patient name: Tanya AmenDaisy [name]  Medical record number: [id number]  Date of birth: Apr 19, 1916   I have seen and evaluated this patient and I have discussed the plan of care with the house staff. Please see their note for complete details. I concur with their findings with the following additions/corrections: Pt's family at bedside and IM team updated them on plan. Confusion over ABX - final decision was to not to restart Abx. Family has noticed O2 by [place] makes pt comfortable so continuing O2. Pt is comfortable, family has not noticed andy signs of discomfort. Team working on [employer] plans - [place]?   Burns SpainElizabeth A Butcher, MD 05/10/2016, 2:34 PM

## 2016-05-10 NOTE — Progress Notes (Signed)
Subjective: [name] is doing well today. Pallative care spoke with the patient's family yesterday. The family decided that given the arc of [name]'s decline over the past several months, likely related to her dementia, they would like to change their plan for care to comfort measures only with no life prolonging measures. She is comfortable this morning.  Objective: Vital signs in last 24 hours: Vitals:   05/09/16 0725 05/09/16 1153 05/09/16 2147 05/10/16 0530  BP: 135/61 112/68 117/87 121/86  Pulse: (!) 119 (!) 132 82 63  Resp: 16 18 16 16   Temp: 97.9 F (36.6 C) 97.8 F (36.6 C) 97.7 F (36.5 C) 97.5 F (36.4 C)  TempSrc: Oral Oral Oral Oral  SpO2: 100% 97% 97% 98%  Weight: 31.9 kg (70 lb 6.4 oz)   31.2 kg (68 lb 11.2 oz)  Height:       Weight change:   Intake/Output Summary (Last 24 hours) at 05/10/16 40980711 Last data filed at 05/09/16 1300  Gross per 24 hour  Intake              120 ml  Output                0 ml  Net              120 ml   BP 121/86 (BP Location: Right Arm)   Pulse 63   Temp 97.5 F (36.4 C) (Oral)   Resp 16   Ht 5' (1.524 m)   Wt 31.2 kg (68 lb 11.2 oz)   SpO2 98%   BMI 13.42 kg/m   General Appearance:    Alert, cooperative, no distress, appears stated age  Lungs:     Clear to auscultation bilaterally, respirations unlabored   Heart:    Regular rate and rhythm, S1 and S2 normal, no murmur, rub   or gallop   Lab, Micro, Studies Results: No new labs, micro or studies  Medications: I have reviewed the patient's current medications. JXB:[id number]:sodium chloride, acetaminophen **OR** acetaminophen, albuterol, antiseptic oral rinse, glycopyrrolate **OR** glycopyrrolate **OR** glycopyrrolate, haloperidol **OR** haloperidol **OR** haloperidol lactate, morphine injection, morphine CONCENTRATE **OR** morphine CONCENTRATE, ondansetron **OR** ondansetron (ZOFRAN) IV, polyvinyl alcohol, sodium chloride flush Scheduled Meds: . citalopram  20 mg Oral Daily  .  guaiFENesin  400 mg Oral Q8H  . QUEtiapine  50 mg Oral QHS  . sodium chloride flush  3 mL Intravenous Q12H  . sodium chloride flush  3 mL Intravenous Q12H   Continuous Infusions: PRN Meds:.sodium chloride, acetaminophen **OR** acetaminophen, albuterol, antiseptic oral rinse, glycopyrrolate **OR** glycopyrrolate **OR** glycopyrrolate, haloperidol **OR** haloperidol **OR** haloperidol lactate, morphine injection, morphine CONCENTRATE **OR** morphine CONCENTRATE, ondansetron **OR** ondansetron (ZOFRAN) IV, polyvinyl alcohol, sodium chloride flush   Assessment/Plan: Principal Problem:   CAP (community acquired pneumonia) due to influenza A virus Active Problems:   Acute respiratory failure with hypoxia (HCC)   HLD (hyperlipidemia)   CAD (coronary artery disease)   Dementia without behavioral disturbance   Atrial fibrillation (HCC)   Influenza due to identified novel influenza A virus with other respiratory manifestations   Palliative care by specialist   Terminal care   Advance care planning  CAP secondary to Influenza A: Family made decision to stop therapy yesterday. Patient received 3 of 5 days of azithromycin, ceftriaxone, and Tamiflu.  Dementia: Likely terminal and exacerbated by stress of acute illness. Patient has declined over past several months and is unlikely to make a meaningful recovery. Family has decided on comfort  measures only. Will continue oxygen at the request of family. - Seroquel 50 mg bid - Morphine 5mg  SL q1hr prn for moderate shortness of breath - lorazepam 1mg  SL or IV q4H prn for anxiety/agitation - haloperidol 0.5mg  PO IV or SL q4H prn for anxiety/agitation - 1mg  Robinul PO or 0.2mg  IV or SL q4H prn for secretions  Dispo: Pending availability of bed at [facility] for residential hospice.   This is a Psychologist, occupational Note.  The care of the patient was discussed with Dr. Karma Greaser and the assessment and plan formulated with their assistance.  Please see their  attached note for official documentation of the daily encounter.   LOS: 3 days   [name], Medical Student 05/10/2016, 7:11 AM

## 2016-05-10 NOTE — Progress Notes (Signed)
[facility]:  Spoke with Heather at [facility] and confirmed eligibility.  Still no beds at this time.   CSW aware.   Thank you, Adele BarthelAmy Evans, RN, BSN [facility]ison [phone]  All hospital liaison's are now on AMION.  Please feel free to contact me at the number above or call hospice at [phone] after 5 pm.

## 2016-05-10 NOTE — Progress Notes (Addendum)
Subjective: [name] is resting comfortably this morning. I was unable to arouse the patient to voice or gentle shaking. Daughter at bedside reports she got a dose of morphine shortly before I came to examine her. She is breathing deeply. Daughter reports they discussed with palliative care yesterday and she is full comfort care. Did not attempt more aggressive maneuvers to arouse the patient due to this. Daughter did report that they would like to finish her current antibiotic course. She also indicated that they would like to continue nasal cannula as needed until she is able to fully discuss with the rest of her family.   Objective: Vital signs in last 24 hours: Vitals:   05/09/16 0725 05/09/16 1153 05/09/16 2147 05/10/16 0530  BP: 135/61 112/68 117/87 121/86  Pulse: (!) 119 (!) 132 82 63  Resp: 16 18 16 16   Temp: 97.9 F (36.6 C) 97.8 F (36.6 C) 97.7 F (36.5 C) 97.5 F (36.4 C)  TempSrc: Oral Oral Oral Oral  SpO2: 100% 97% 97% 98%  Weight: 70 lb 6.4 oz (31.9 kg)   68 lb 11.2 oz (31.2 kg)  Height:       Weight change:   Intake/Output Summary (Last 24 hours) at 05/10/16 [id number] Last data filed at 05/09/16 1300  Gross per 24 hour  Intake              120 ml  Output                0 ml  Net              120 ml    Physical Exam  Constitutional:  Thin, frail, cachetic elderly female resting comfortably in bed in no acute distress  Cardiovascular:  Irregularly irregular rhythm, regular rate  Pulmonary/Chest: Effort normal.  On anterior exam, has persistent course breathe sounds throughout. No wheezing.  Abdominal: Soft. Bowel sounds are normal.  Musculoskeletal: She exhibits no edema.  Skin: Skin is warm and dry.   Medications: I have reviewed the patient's current medications. Scheduled Meds: . citalopram  20 mg Oral Daily  . guaiFENesin  400 mg Oral Q8H  . QUEtiapine  50 mg Oral QHS  . sodium chloride flush  3 mL Intravenous Q12H  . sodium chloride flush  3 mL  Intravenous Q12H   Continuous Infusions: PRN Meds:.sodium chloride, acetaminophen **OR** acetaminophen, albuterol, antiseptic oral rinse, glycopyrrolate **OR** glycopyrrolate **OR** glycopyrrolate, haloperidol **OR** haloperidol **OR** haloperidol lactate, morphine injection, morphine CONCENTRATE **OR** morphine CONCENTRATE, ondansetron **OR** ondansetron (ZOFRAN) IV, polyvinyl alcohol, sodium chloride flush Assessment/Plan:  CAP secondary to Influenza A: Sleeping deeply this morning and difficulty to arouse. Did not attempt aggressive maneuvers to arouse patient as family is focused on comfort care measures. Will reassess later this morning. She continues to be in A. Fib with rates up to 130s yesterday. Rate is in the 60s this morning without any interventions. Blood cultures with 1/2 growing coag negative staph, likely represent a contaminant. Appears her antibiotics were discontinued after discussion with palliative yesterday and her last dose was on 2/4 for 3 days. Discussed with family and will not resume ABX today. They would like to continue O2 as needed for comfort.  -Transition to comfort care measures, appreciate palliative care's assistance  Atrial Fibrillation: HR in the 60s this morning. Diltiazem d/c and transitioning to comfort care measures.    Hypotension likely secondary to volume depletion:  Resolved with fluids.    Dementia:  Seroquel 50 mg bid  Dispo: Disposition is deferred at this time, awaiting improvement of current medical problems.  Anticipated discharge in approximately 1-2 day(s).   The patient does have a current PCP (Pcp Not In System) and does need an [facility] hospital follow-up appointment after discharge.  The patient does not have transportation limitations that hinder transportation to clinic appointments.  .Services Needed at time of discharge: Y = Yes, Blank = No PT:   OT:   RN:   Equipment:   Other:     LOS: 3 days   Valentino Nose, MD  05/10/2016,  7:12 AM

## 2016-05-11 ENCOUNTER — Encounter (HOSPITAL_COMMUNITY): Payer: Self-pay

## 2016-05-11 DIAGNOSIS — J1100 Influenza due to unidentified influenza virus with unspecified type of pneumonia: Secondary | ICD-10-CM

## 2016-05-11 LAB — CULTURE, BLOOD (ROUTINE X 2): Culture: NO GROWTH

## 2016-05-11 MED ORDER — LORAZEPAM 2 MG/ML PO CONC
2.0000 mg | ORAL | Status: DC | PRN
  Administered 2016-05-11: 2 mg via SUBLINGUAL
  Filled 2016-05-11: qty 1

## 2016-05-11 NOTE — Clinical Social Work Note (Addendum)
[name] still does not have any beds available. CSW met with patient's daughter at bedside, [name]. Discussed other options and provided list of inpatient hospice facilities. Patient's daughter stated that the next best options would be either [place] or [place]. She will discuss with her sisters. CSW will follow up after lunch.  [name], [place] [phone]  2:50 pm Patient's daughters have different opinions on whether patient should go to [place] or [place]. Patient's church is close to the [place] hospice facility so will start with referral there. CSW discussed with admissions coordinator, [name], and clinicals were faxed over for review.  [name], [place] [phone]  3:38 pm [facility] will have a bed available tomorrow. [name] has a bed available today. CSW awaiting call back from MD to determine when patient is ready for discharge. CSW updated patient's daughter at bedside.  [name], CSW [phone]  4:48 pm Per MD, will wait until tomorrow for discharge.  [name], [place]

## 2016-05-11 NOTE — Progress Notes (Signed)
Per family and NP with palliative care, IV access will be discontinued, and pt will receive po or sl medications.

## 2016-05-11 NOTE — Progress Notes (Addendum)
[facility]:  Spoke with [facility], still no availability at this time.  Spoke with Maralyn SagoSarah, SW to advise of same.   If you have any hospice related questions or concerns, please feel free to contact me at [phone].  Thanks,   Adele BarthelAmy Evans, RN, BSN [facility]iaison  All hospital liaison's are now on AMION.  Please contact me at the number above or call hospice at [phone] after 5pm.

## 2016-05-11 NOTE — Progress Notes (Signed)
Daily Progress Note   Patient Name: [name]       Date: 05/11/2016 DOB: [birth year]  Age: 83 y.o. MRN#: [id number] Attending Physician: Burns SpainElizabeth A Butcher, MD Primary Care Physician: Pcp Not In System Admit Date: 05/11/2016  Reason for Consultation/Follow-up: Non pain symptom management, Pain control, Psychosocial/spiritual support and Terminal Care  Subjective: Patient in bed. Arouses to voices. Complains of pain, requesting medications. Daughter, Tanya QuinLinda, at bedside. Reports patient's caregiver, Tanya FiscalLori stayed with her last night and she slept well. Tanya SavoyGave [name] emotional support as she is processing and grieving the loss of her Mom. Still hoping for residential hospice placement.   Review of Systems  Unable to perform ROS: Acuity of condition    Length of Stay: 4  Current Medications: Scheduled Meds:  . QUEtiapine  50 mg Oral QHS  . sodium chloride flush  3 mL Intravenous Q12H  . sodium chloride flush  3 mL Intravenous Q12H    Continuous Infusions:   PRN Meds: sodium chloride, acetaminophen **OR** acetaminophen, albuterol, antiseptic oral rinse, glycopyrrolate **OR** glycopyrrolate **OR** glycopyrrolate, LORazepam, morphine injection, morphine CONCENTRATE **OR** morphine CONCENTRATE, ondansetron **OR** ondansetron (ZOFRAN) IV, polyvinyl alcohol, sodium chloride flush  Physical Exam  Constitutional:  Cachetic, no po intake  Neurological:  Somnolent, repeating what other's say, answers positive to inquiry of pain, asks independently for "caregive", responds to "do you want pain medicine?" with "oh, yes"  Skin: Skin is warm and dry. There is pallor.            Vital Signs: BP 103/77 (BP Location: Left Arm)   Pulse 93   Temp 98 F (36.7 C) (Oral)   Resp 18   Ht 5' (1.524 m)   Wt  31.3 kg (69 lb 1.6 oz) Comment: bedscale   SpO2 92%   BMI 13.50 kg/m  SpO2: SpO2: 92 % O2 Device: O2 Device: Nasal Cannula O2 Flow Rate: O2 Flow Rate (L/min): 2 L/min  Intake/output summary:  Intake/Output Summary (Last 24 hours) at 05/11/16 1024 Last data filed at 05/11/16 0900  Gross per 24 hour  Intake                0 ml  Output                0 ml  Net  0 ml   LBM: Last BM Date: 05/08/16 Baseline Weight: Weight: 43.1 kg (95 lb) Most recent weight: Weight: 31.3 kg (69 lb 1.6 oz) (bedscale )       Palliative Assessment/Data: PPS: 10%    Flowsheet Rows   Flowsheet Row Most Recent Value  Intake Tab  Referral Department  -- [internal medicine]  Unit at Time of Referral  Cardiac/Telemetry Unit  Palliative Care Primary Diagnosis  Sepsis/Infectious Disease  Date Notified  05/08/16  Palliative Care Type  New Palliative care  Reason for referral  End of Life Care Assistance  Date of Admission  10-May-2016  Date first seen by Palliative Care  05/09/16  # of days IP prior to Palliative referral  2  Clinical Assessment  Palliative Performance Scale Score  10%  Psychosocial & Spiritual Assessment  Social Work Social research officer, government patient/family wishes with healthcare team, Participated in [employer], Advance care planning, Education on Hospice  Palliative Care Outcomes  Patient/Family meeting held?  Yes  Who was at the meeting?  Three daughters- [name] and [name]  Palliative Care Outcomes  Improved non-pain symptom therapy, Counseled regarding hospice, Provided advance care planning, Changed to focus on comfort, Transitioned to hospice, Provided end of life care assistance, Clarified goals of care  Patient/Family wishes: Interventions discontinued/not started   Mechanical Ventilation, BiPAP, Hemodialysis, Transfusion, Tube feedings/TPN, Antibiotics, Vasopressors, NIPPV, Trach, PEG  Palliative Care follow-up planned  Yes, Facility      Patient Active  Problem List   Diagnosis Date Noted  . Palliative care by specialist   . Terminal care   . Advance care planning   . Influenza due to identified novel influenza A virus with other respiratory manifestations   . Atrial fibrillation (HCC)   . CAP (community acquired pneumonia) due to influenza A virus May 10, 2016  . Acute respiratory failure with hypoxia (HCC) 2016/05/10  . HLD (hyperlipidemia) 10-May-2016  . CAD (coronary artery disease) May 10, 2016  . Dementia without behavioral disturbance 10-May-2016    Palliative Care Assessment & Plan   Patient Profile: 83 y.o.femalewith past medical history of coronary artery disease (s/p bypass), HTN, HLD, CVA, and dementia admitted on 02-06-2018with difficulty breathing. Workup revealed Influenza A and pneumonia.Family has decided to make transition to comfort measures in light of her significant overall decline over the last year. Their priority is to assure peaceful, comfortable death.   Assessment/Recommendations/Plan   Aggressive pain and comfort measures  Will change SL morphine intensol to q30 min since IV access has been lost  Hopeful for residential Hospice  Goals of Care and Additional Recommendations:  Limitations on Scope of Treatment: Full Comfort Care  Code Status:  DNR  Prognosis:   < 2 weeks  Discharge Planning:  Hospice facility  Care plan was discussed with daughter, [name], and patient's RN.   Thank you for allowing the Palliative Medicine Team to assist in the care of this patient.   Total Time: 30 minutes Greater than 50%  of this time was spent counseling and coordinating care related to the above assessment and plan.  Ocie Bob, AGNP-C Palliative Medicine   Please contact Palliative Medicine Team phone at [phone] for questions and concerns.

## 2016-05-11 NOTE — Progress Notes (Signed)
pts family remains at bedside. Comfort cart requested for room, until pt's care is complete.

## 2016-05-11 NOTE — Progress Notes (Signed)
   Subjective: [name] was resting comfortably in bed this morning. Arouses to voice but quickly falls back asleep. Daughter reports she was complaining of pain but then reported not further pain a few minutes later. Daughter at bedside reports she rested well overnight and has not been in any discomfort.   Objective: Vital signs in last 24 hours: Vitals:   05/10/16 1155 05/10/16 2129 05/11/16 0637 05/11/16 0900  BP: 102/71 120/72 118/79 103/77  Pulse: (!) 54 62 85 93  Resp: 18 12 14 18   Temp: 97.5 F (36.4 C) 98 F (36.7 C) 98 F (36.7 C)   TempSrc: Axillary Axillary Oral   SpO2: 94% 98% 93% 92%  Weight:   69 lb 1.6 oz (31.3 kg)   Height:       Weight change: -1 lb 4.8 oz (-0.59 kg)  Intake/Output Summary (Last 24 hours) at 05/11/16 1034 Last data filed at 05/11/16 0900  Gross per 24 hour  Intake                0 ml  Output                0 ml  Net                0 ml    Physical Exam  Constitutional:  Thin, frail, cachetic elderly female resting comfortably in bed in no acute distress  Cardiovascular:  Irregularly irregular rhythm, regular rate  Pulmonary/Chest: Effort normal.  Coarse breath sounds improving bilaterally on anterior exam  Musculoskeletal: She exhibits no edema.   Medications: I have reviewed the patient's current medications. Scheduled Meds: . QUEtiapine  50 mg Oral QHS  . sodium chloride flush  3 mL Intravenous Q12H  . sodium chloride flush  3 mL Intravenous Q12H   Continuous Infusions: PRN Meds:.sodium chloride, acetaminophen **OR** acetaminophen, albuterol, antiseptic oral rinse, glycopyrrolate **OR** glycopyrrolate **OR** glycopyrrolate, LORazepam, morphine injection, morphine CONCENTRATE **OR** morphine CONCENTRATE, ondansetron **OR** ondansetron (ZOFRAN) IV, polyvinyl alcohol, sodium chloride flush Assessment/Plan:  CAP secondary to Influenza A: Focusing on comfort care measures. Will continue 2L [place] for comfort but do not need to check O2  saturations or increase flow. Has SL morphine q30 minutes per palliative since IV access was lost this morning. [facility], [facility] today.   Atrial Fibrillation: HR in the 80-90s. Comfort care measures.   Hypotension likely secondary to volume depletion:  Resolved with fluids.    Dementia:  Seroquel 50 mg bid  Dispo: Disposition is deferred at this time, awaiting improvement of current medical problems.  Anticipated discharge pending bed availability.   The patient does have a current PCP (Pcp Not In System) and [facility] after discharge.  The patient does not have transportation limitations that hinder transportation to clinic appointments.  .Services Needed at time of discharge: Y = Yes, Blank = No PT:   OT:   RN:   Equipment:   Other:     LOS: 4 days   Valentino NoseNathan Cordelro Gautreau, MD  05/11/2016, 10:34 AM

## 2016-05-11 NOTE — Progress Notes (Signed)
pts daughter is at bedside with teaching services. Pt's daughter is sharing stories of patient's life, in good spirits. Pt is resting, not involved in conversation; though responsive to some voice/physical stimuli.

## 2016-05-11 NOTE — Progress Notes (Signed)
  Date: 05/11/2016  Patient name: [name]  Medical record number: [id number]  Date of birth: 06-05-1916   I have seen and evaluated this patient and I have discussed the plan of care with the house staff. Please see their note for complete details. I concur with their findings with the following additions/corrections: Ms [name] is resting comfortably. She is mouth breathing. O2 PRN for comfort. Daughter is at bedside and is observing for signs of discomfort which are not present currently. Social [facility] has [facility] not [facility] and are investigating other alternatives.  Burns SpainElizabeth A Carlis Blanchard, MD 05/11/2016, 11:56 AM

## 2016-05-11 NOTE — Progress Notes (Signed)
Subjective: [name] was resting comfortably this morning. Her daughter felt she was stable and comfortable overnight. She continues to be comfort care and is pending placement in an inpatient hospice facility.  Objective: Vital signs in last 24 hours: Vitals:   05/10/16 1155 05/10/16 2129 05/11/16 0637 05/11/16 0900  BP: 102/71 120/72 118/79 103/77  Pulse: (!) 54 62 85 93  Resp: 18 12 14 18   Temp: 97.5 F (36.4 C) 98 F (36.7 C) 98 F (36.7 C)   TempSrc: Axillary Axillary Oral   SpO2: 94% 98% 93% 92%  Weight:   31.3 kg (69 lb 1.6 oz)   Height:       Weight change: -0.59 kg (-1 lb 4.8 oz)  Intake/Output Summary (Last 24 hours) at 05/11/16 1150 Last data filed at 05/11/16 0900  Gross per 24 hour  Intake                0 ml  Output                0 ml  Net                0 ml   BP 103/77 (BP Location: Left Arm)   Pulse 93   Temp 98 F (36.7 C) (Oral)   Resp 18   Ht 5' (1.524 m)   Wt 31.3 kg (69 lb 1.6 oz) Comment: bedscale   SpO2 92%   BMI 13.50 kg/m   General Appearance:    Resting comfortably, asleep  Lungs:     Clear to auscultation bilaterally, respirations unlabored   Heart:    Regular rate and rhythm, S1 and S2 normal, no murmur, rub   or gallop   Lab, Micro and Other Study Results: No new studies  Medications: I have reviewed the patient's current medications. ZOX:WRUEAVPRN:sodium chloride, acetaminophen **OR** acetaminophen, albuterol, antiseptic oral rinse, glycopyrrolate **OR** glycopyrrolate **OR** glycopyrrolate, LORazepam, morphine injection, morphine CONCENTRATE **OR** morphine CONCENTRATE, ondansetron **OR** ondansetron (ZOFRAN) IV, polyvinyl alcohol, sodium chloride flush Scheduled Meds: . QUEtiapine  50 mg Oral QHS  . sodium chloride flush  3 mL Intravenous Q12H  . sodium chloride flush  3 mL Intravenous Q12H   Continuous Infusions: PRN Meds:.sodium chloride, acetaminophen **OR** acetaminophen, albuterol, antiseptic oral rinse, glycopyrrolate **OR**  glycopyrrolate **OR** glycopyrrolate, LORazepam, morphine injection, morphine CONCENTRATE **OR** morphine CONCENTRATE, ondansetron **OR** ondansetron (ZOFRAN) IV, polyvinyl alcohol, sodium chloride flush Assessment/Plan: Principal Problem:   CAP (community acquired pneumonia) due to influenza A virus Active Problems:   Acute respiratory failure with hypoxia (HCC)   HLD (hyperlipidemia)   CAD (coronary artery disease)   Dementia without behavioral disturbance   Atrial fibrillation (HCC)   Influenza due to identified novel influenza A virus with other respiratory manifestations   Palliative care by specialist   Terminal care   Advance care planning   Influenza with pneumonia  CAP secondary to Influenza A: Focusing on comfort measures. Will continue 2L [place] for comfort, no need to check O2 sats or increase flow rate.  - morphine SL PRN - Zofran ODT PRN - lorazepam SL PRN  Atrial Fibrillation: HR in 80s to 90s. Stable  Hypotension: Resolved  Dementia: Seroquel 50mg  BID  Dispo: Comfort measures, pending placement at an inpatient hospice facility.   This is a Psychologist, occupationalMedical Student Note.  The care of the patient was discussed with [name] and the assessment and plan formulated with their assistance.  Please see their attached note for official documentation of the daily encounter.  LOS: 4 days   [name], Medical Student 05/11/2016, 11:50 AM

## 2016-06-02 NOTE — Progress Notes (Signed)
Patient expired at 0419 am. Confirmed by two RN'S. Patient has no pulse and  no chest rise  Family at bedside. MD notified at 0424 am.

## 2016-06-02 DEATH — deceased
